# Patient Record
Sex: Female | Born: 1990 | Race: White | Hispanic: No | Marital: Married | Smoking: Never smoker
Health system: Southern US, Community
[De-identification: ages and names within clinical notes are randomized; demographics above are authoritative.]

## PROBLEM LIST (undated history)

## (undated) DIAGNOSIS — Z808 Family history of malignant neoplasm of other organs or systems: Secondary | ICD-10-CM

## (undated) DIAGNOSIS — Z800 Family history of malignant neoplasm of digestive organs: Secondary | ICD-10-CM

## (undated) DIAGNOSIS — Z8042 Family history of malignant neoplasm of prostate: Secondary | ICD-10-CM

## (undated) DIAGNOSIS — I7300 Raynaud's syndrome without gangrene: Secondary | ICD-10-CM

## (undated) HISTORY — PX: TUMOR REMOVAL: SHX12

## (undated) HISTORY — PX: CHOLECYSTECTOMY: SHX55

## (undated) HISTORY — DX: Family history of malignant neoplasm of other organs or systems: Z80.8

## (undated) HISTORY — DX: Family history of malignant neoplasm of prostate: Z80.42

## (undated) HISTORY — DX: Family history of malignant neoplasm of digestive organs: Z80.0

---

## 2021-06-12 ENCOUNTER — Encounter: Payer: Self-pay | Admitting: Psychiatry

## 2021-06-12 ENCOUNTER — Ambulatory Visit (INDEPENDENT_AMBULATORY_CARE_PROVIDER_SITE_OTHER): Payer: Commercial Managed Care - PPO | Admitting: Psychiatry

## 2021-06-12 ENCOUNTER — Other Ambulatory Visit: Payer: Self-pay

## 2021-06-12 DIAGNOSIS — F4323 Adjustment disorder with mixed anxiety and depressed mood: Secondary | ICD-10-CM

## 2021-06-12 NOTE — Progress Notes (Signed)
[facility] Counselor Initial Adult Exam  Name: Nicole Tate Date: 06/13/2021 MRN: [id number] DOB: [birth year] PCP: No primary care provider on file.  Time spent: 54 minutes start time 11:06 AM end time 12:00 PM   Guardian/Payee:  Patient    Paperwork requested:  Yes   Reason for Visit /Presenting Problem: Patient was present for session.  She was referred to treatment by a friend. Patient shared she has been having more anxiety.  She shared she and her husband were buying a house and the sellers backed out at the last minute and they had to end up moving in with friends for a while before they were able to find another home. Her husband had surgery and than they moved.  They have been married a year and they are still trying to figure out how to handle everything.  She is adjusting work marriage and moving. She shared there is lots of support at church which helped with all the moving. 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 She shared that she is finding herself at night not wanting to talk. Mom passed of Pancreatic cancer in 2010. Father remarried a year and half after mother passed.  Patient shared she is close to her stepmother.  There is a dynamic with her brother not getting along with her and father. It is difficult on the anniversary death date of her mother. Step mother has 2 children, everyone gets along well. Husband is in process of becoming a resident of the [place].  There are lots of cultural differences in the family.  She shared he is very calm but it is hard to handle the differecnes in their strategies of managing stress. Patient has taken Zoloft in the past currently on birth control pills currently.  Patient stated that she would like to learn some ways to manage all of the different stressors in her life and be able to connect with her husband in an appropriate manner.  Patient was  encouraged to continue thinking through what that would look like for her to be discussed further at next session when treatment planning goals will be developed.  Mental Status Exam:    Appearance:   Well Groomed     Behavior:  Appropriate  Motor:  Normal  Speech/Language:   Normal Rate  Affect:  Appropriate  Mood:  anxious  Thought process:  normal  Thought content:    WNL  Sensory/Perceptual disturbances:    WNL  Orientation:  oriented to person, place, time/date, and situation  Attention:  Good  Concentration:  Good  Memory:  WNL  Fund of knowledge:   Good  Insight:    Good  Judgment:   Good  Impulse Control:  Good   Reported Symptoms:  anxiety, crying spells, fatigue, sadness, sleep issues  Risk Assessment: Danger to Self:  No Self-injurious Behavior: No Danger to Others: No Duty to Warn:no Physical Aggression / Violence:No  Access to Firearms a concern: No  Gang Involvement:No  Patient / guardian was educated about steps to take if suicide or homicide risk level increases between visits: yes While future psychiatric events cannot be accurately predicted, the patient does not currently require acute inpatient psychiatric care and does not currently meet [facility] involuntary commitment criteria.  Substance Abuse History: Current substance abuse: No     Past Psychiatric History:   Previous psychological history is significant  for anxiety Outpatient Providers:no current but has seen 2 counselors prior History of Psych Hospitalization: No  Psychological Testing:  none    Abuse History: Victim of No.,  none    Report needed: No. Victim of Neglect:No. Perpetrator of  none   Witness / Exposure to Domestic Violence: No   Protective Services Involvement: No  Witness to [employer] Violence:  No   Family History:  Family History  Problem Relation Age of Onset   Depression Mother    Pancreatic cancer Mother     Living situation: the patient lives with their  spouse  Sexual Orientation:  Straight  Relationship Status: married  Name of spouse / other:Nicole Tate             If a parent, number of children / ages:none  Lawyer; spouse friends parents Community group at [employer] Stress:  No   Income/Employment/Disability: Employment  Financial planner: No   Educational History: Education: post Engineer, maintenance (IT) work or degree  Religion/Sprituality/World View:    Ephriam Knuckles goes to [employer]  Any cultural differences that may affect / interfere with treatment:  not applicable   Recreation/Hobbies: walking, hiking, reading, cooking  Stressors:Other: work, transitions, relationship transitions  Strengths:  Supportive Relationships, Church, [employer], and Spirituality  Barriers:  none   Legal History: Pending legal issue / charges:  none. History of legal issue / charges:  none  Medical History/Surgical History:reviewed History reviewed. No pertinent past medical history.  History reviewed. No pertinent surgical history.  Medications: No current outpatient medications on file.   No current facility-administered medications for this visit.  Birth control pills  Not on File  Diagnoses:    ICD-10-CM   1. Adjustment disorder with mixed anxiety and depressed mood  F43.23       Plan of Care: Patient is to develop treatment plan and set goals at next session.   Stevphen Meuse, [facility]

## 2021-08-06 ENCOUNTER — Ambulatory Visit (INDEPENDENT_AMBULATORY_CARE_PROVIDER_SITE_OTHER): Payer: Commercial Managed Care - PPO | Admitting: Psychiatry

## 2021-08-06 ENCOUNTER — Other Ambulatory Visit: Payer: Self-pay

## 2021-08-06 DIAGNOSIS — F4323 Adjustment disorder with mixed anxiety and depressed mood: Secondary | ICD-10-CM

## 2021-08-06 NOTE — Progress Notes (Signed)
      [facility] Counselor/Therapist Progress Note  Patient ID: Nicole Tate, MRN: [id number],    Date: 08/06/2021  Time Spent: 50 minutes start time 10:05 AM end time 10:55 AM  Treatment Type: Individual Therapy  Reported Symptoms: anxiety  Mental Status Exam:  Appearance:   Well Groomed     Behavior:  Appropriate  Motor:  Normal  Speech/Language:   Normal Rate  Affect:  Appropriate  Mood:  normal  Thought process:  normal  Thought content:    WNL  Sensory/Perceptual disturbances:    WNL  Orientation:  oriented to person, place, time/date, and situation  Attention:  Good  Concentration:  Good  Memory:  WNL  Fund of knowledge:   Good  Insight:    Good  Judgment:   Good  Impulse Control:  Good   Risk Assessment: Danger to Self:  No Self-injurious Behavior: No Danger to Others: No Duty to Warn:no Physical Aggression / Violence:No  Access to Firearms a concern: No  Gang Involvement:No   Subjective: Patient was present for session.  Develop treatment plan and set goals in session.  Patient reported the thing that was creating the most stress for her currently was her husband discussing possible work change.  Did EMDR set on that issue, suds level 8, negative cognition "I am not secure" felt anxiety in her chest.  Patient was able to reduce suds level to 4.  Discussed ways that she can communicate concerns and needs with has been appropriately.  Discussed different coping skills that she can utilize to help decrease anxiety appropriately.  Interventions: Solution-Oriented/Positive Psychology and Eye Movement Desensitization and Reprocessing (EMDR)  Diagnosis:   ICD-10-CM   1. Adjustment disorder with mixed anxiety and depressed mood  F43.23       Plan: Patient is to use coping skills to decrease anxiety symptoms.  Patient is to communicate her needs with her husband appropriately.  Patient is to exercise to release negative emotions appropriately. Long term goal:  Establish an inward sense of self worth confidence and competence Short term goal:Develop a healthy work life balance - developing healthy self care habits  Stevphen Meuse, [facility]

## 2021-08-20 ENCOUNTER — Ambulatory Visit (INDEPENDENT_AMBULATORY_CARE_PROVIDER_SITE_OTHER): Payer: Commercial Managed Care - PPO | Admitting: Psychiatry

## 2021-08-20 ENCOUNTER — Other Ambulatory Visit: Payer: Self-pay

## 2021-08-20 DIAGNOSIS — F4323 Adjustment disorder with mixed anxiety and depressed mood: Secondary | ICD-10-CM | POA: Diagnosis not present

## 2021-08-20 NOTE — Progress Notes (Signed)
      [facility] Counselor/Therapist Progress Note  Patient ID: Nicole Tate, MRN: [id number],    Date: 08/20/2021  Time Spent: 56 minutes start time 10:08 a.m. end time 11:02 a.m.  Treatment Type: Individual Therapy  Reported Symptoms: anxiety, headaches, obsessive thinking  Mental Status Exam:  Appearance:   Well Groomed     Behavior:  Appropriate  Motor:  Normal  Speech/Language:   Normal Rate  Affect:  Appropriate  Mood:  anxious  Thought process:  normal  Thought content:    WNL  Sensory/Perceptual disturbances:    WNL  Orientation:  oriented to person, place, time/date, and situation  Attention:  Good  Concentration:  Good  Memory:  WNL  Fund of knowledge:   Good  Insight:    Good  Judgment:   Good  Impulse Control:  Good   Risk Assessment: Danger to Self:  No Self-injurious Behavior: No Danger to Others: No Duty to Warn:no Physical Aggression / Violence:No  Access to Firearms a concern: No  Gang Involvement:No   Subjective: Patient was present for session.  She shared that she had continued processing after last session.  She was able to talk with her husband about the issues.  Patient stated she wants to work on issues with her husband concerning miscommunication in session.  Suds level 9, negative cognition "I am not explaining this well" felt anxiety and head.  Patient was able to reduce suds level to 5.  Patient was able to recognize that many of her issues are actually tied to unhealthy behaviors of her brother to try and help him deal with his issues.  Ways for her to take care of herself and be able to maintain relationships with her brother and husband were discussed with patient and plans were developed in session.  Interventions: Solution-Oriented/Positive Psychology, Insight-Oriented, and brain spotting  Diagnosis:   ICD-10-CM   1. Adjustment disorder with mixed anxiety and depressed mood  F43.23       Plan: Patient is to use CBT and coping  skills to decrease anxiety symptoms.  Patient is to write out the things that she loves about her husband to read when she gets triggered.  Patient is to follow plans from session to interact a little differently with her brother.  Patient is to release negative emotions appropriately through exercise and journaling. Long term goal: Establish an inward sense of self worth confidence and competence Short term goal:Develop a healthy work life balance - developing healthy self care habits  Stevphen Meuse, [facility]

## 2021-09-03 ENCOUNTER — Ambulatory Visit (INDEPENDENT_AMBULATORY_CARE_PROVIDER_SITE_OTHER): Payer: Commercial Managed Care - PPO | Admitting: Psychiatry

## 2021-09-03 ENCOUNTER — Other Ambulatory Visit: Payer: Self-pay

## 2021-09-03 DIAGNOSIS — F4323 Adjustment disorder with mixed anxiety and depressed mood: Secondary | ICD-10-CM

## 2021-09-03 NOTE — Progress Notes (Signed)
      [facility] Counselor/Therapist Progress Note  Patient ID: Nicole Tate, MRN: [id number],    Date: 09/03/2021  Time Spent: 60 minutes start time 10:06 Amend time 11:02 AM  Treatment Type: Individual Therapy  Reported Symptoms: anxiety, panic  Mental Status Exam:  Appearance:   Well Groomed     Behavior:  Appropriate  Motor:  Normal  Speech/Language:   Normal Rate  Affect:  Appropriate  Mood:  anxious  Thought process:  normal  Thought content:    WNL  Sensory/Perceptual disturbances:    WNL  Orientation:  oriented to person, place, time/date, and situation  Attention:  Good  Concentration:  Good  Memory:  WNL  Fund of knowledge:   Good  Insight:    Good  Judgment:   Good  Impulse Control:  Good   Risk Assessment: Danger to Self:  No Self-injurious Behavior: No Danger to Others: No Duty to Warn:no Physical Aggression / Violence:No  Access to Firearms a concern: No  Gang Involvement:No   Subjective: Patient was present for session. She shared she had panic over the weekend when she was at a high rise building in [place], the floor moved some and it created panic for her her.  Patient did processing set on that issue.  Suds level 5, negative cognition "I am out of control" felt anxiety and frustration in her chest.  Patient was able to reduce suds level to 3.  Encourage patient to recognize the need to continue with her self-care especially due to getting triggered with her work situation.  Patient agreed to continue finding ways to release negative emotions appropriately.  Interventions: Solution-Oriented/Positive Psychology, Eye Movement Desensitization and Reprocessing (EMDR), Insight-Oriented, and BS P  Diagnosis:   ICD-10-CM   1. Adjustment disorder with mixed anxiety and depressed mood  F43.23       Plan: Patient is to use CBT and coping skills to increase confidence.  Patient is to continue working on self-care through exercise.  Patient is to  affirm herself regularly. Long term goal: Establish an inward sense of self worth confidence and competence Short term goal:Develop a healthy work life balance - developing healthy self care habits  Stevphen Meuse, [facility]

## 2021-09-17 ENCOUNTER — Ambulatory Visit (INDEPENDENT_AMBULATORY_CARE_PROVIDER_SITE_OTHER): Payer: Commercial Managed Care - PPO | Admitting: Psychiatry

## 2021-09-17 ENCOUNTER — Other Ambulatory Visit: Payer: Self-pay

## 2021-09-17 DIAGNOSIS — F4323 Adjustment disorder with mixed anxiety and depressed mood: Secondary | ICD-10-CM | POA: Diagnosis not present

## 2021-09-17 NOTE — Progress Notes (Signed)
      [facility] Counselor/Therapist Progress Note  Patient ID: Ronny Ruddell, MRN: [id number],    Date: 09/17/2021  Time Spent: 48 minutes start time 11:12 AM end time 12 PM  Treatment Type: Individual Therapy  Reported Symptoms: anxiety, sadness  Mental Status Exam:  Appearance:   Well Groomed     Behavior:  Appropriate  Motor:  Normal  Speech/Language:   Normal Rate  Affect:  Appropriate  Mood:  anxious  Thought process:  normal  Thought content:    WNL  Sensory/Perceptual disturbances:    WNL  Orientation:  oriented to person, place, time/date, and situation  Attention:  Good  Concentration:  Good  Memory:  WNL  Fund of knowledge:   Good  Insight:    Good  Judgment:   Good  Impulse Control:  Good   Risk Assessment: Danger to Self:  No Self-injurious Behavior: No Danger to Others: No Duty to Warn:no Physical Aggression / Violence:No  Access to Firearms a concern: No  Gang Involvement:No   Subjective: Patient was present for session.  She shared she has made anxiety but she is managing it better and trying to work on her perspective.  She shared that there are lots of work changes happening and she is not sure where things are headed.  Patient did processing set on thinking about the worst case scenarios, suds level 7, negative cognition "I do not have control" felt helpless and vulnerable in her chest.  Patient was able to reduce suds level to 3.  She was able to recognize that much of what is going on is tied to early childhood situations.  Encouraged patient to allow processing to continue between sessions and journal as needed.  Interventions: Eye Movement Desensitization and Reprocessing (EMDR) and Insight-Oriented  Diagnosis:   ICD-10-CM   1. Adjustment disorder with mixed anxiety and depressed mood  F43.23       Plan: Patient is to use coping skills to decrease anxiety symptoms.  Patient is to journal to release negative emotions appropriately and allow  processing to continue.  Patient is to exercise and focus on self-care. Long term goal: Establish an inward sense of self worth confidence and competence Short term goal:Develop a healthy work life balance - developing healthy self care habits  Stevphen Meuse, [facility]

## 2021-10-10 ENCOUNTER — Ambulatory Visit (INDEPENDENT_AMBULATORY_CARE_PROVIDER_SITE_OTHER): Payer: Commercial Managed Care - PPO | Admitting: Psychiatry

## 2021-10-10 ENCOUNTER — Other Ambulatory Visit: Payer: Self-pay

## 2021-10-10 DIAGNOSIS — F4323 Adjustment disorder with mixed anxiety and depressed mood: Secondary | ICD-10-CM

## 2021-10-10 NOTE — Progress Notes (Signed)
°      [facility] Counselor/Therapist Progress Note  Patient ID: Nicole Tate, MRN: [id number],    Date: 10/10/2021  Time Spent: 55 minutes start time 12:04 PM end time 12:59 PM  Treatment Type: Individual Therapy  Reported Symptoms: anxiety, obsessive thoughts, sleep issues  Mental Status Exam:  Appearance:   Well Groomed     Behavior:  Appropriate  Motor:  Normal  Speech/Language:   Normal Rate  Affect:  Appropriate  Mood:  anxious  Thought process:  normal  Thought content:    WNL  Sensory/Perceptual disturbances:    WNL  Orientation:  oriented to person, place, time/date, and situation  Attention:  Good  Concentration:  Good  Memory:  WNL  Fund of knowledge:   Good  Insight:    Good  Judgment:   Good  Impulse Control:  Good   Risk Assessment: Danger to Self:  No Self-injurious Behavior: No Danger to Others: No Duty to Warn:no Physical Aggression / Violence:No  Access to Firearms a concern: No  Gang Involvement:No   Subjective: Patient was present for session.  She shared she is trying to figure out what to do concerning her job situation.  She went on to report she did get triggered when she went home over Thanksgiving.  She also had some obsessive thinking moments.  Patient did processing set on guy coming to the house, suds level 6, negative cognition "I am not safe" felt anxiety in her chest.  Patient was able to reduce suds level to 3.  Encourage patient to work on recognizing that the obsessive thinking is just giving her information and she needs to use that to help affirm herself and remind herself that she is safe.  Also encouraged her to follow through on things that she feels would help her to feel safer even in those situations.  Discussed doing some brain spotting relaxation exercises if needed.  Patient agreed to try some to see how she responds.  Interventions: Solution-Oriented/Positive Psychology, Eye Movement Desensitization and Reprocessing (EMDR),  Insight-Oriented, and BS P  Diagnosis:   ICD-10-CM   1. Adjustment disorder with mixed anxiety and depressed mood  F43.23       Plan: Patient is to use CBT and coping skills to decrease anxiety symptoms.  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  Patient is to practice brain spotting relaxation exercises.  Patient is to continue exercising to release negative emotions appropriately. Long term goal: Establish an inward sense of self worth confidence and competence Short term goal:Develop a healthy work life balance - developing healthy self care habits  Stevphen Meuse, [facility]

## 2021-10-25 ENCOUNTER — Other Ambulatory Visit: Payer: Self-pay

## 2021-10-25 ENCOUNTER — Ambulatory Visit (INDEPENDENT_AMBULATORY_CARE_PROVIDER_SITE_OTHER): Payer: Commercial Managed Care - PPO | Admitting: Psychiatry

## 2021-10-25 DIAGNOSIS — F4323 Adjustment disorder with mixed anxiety and depressed mood: Secondary | ICD-10-CM

## 2021-10-25 NOTE — Progress Notes (Signed)
°      [facility] Counselor/Therapist Progress Note  Patient ID: Nicole Tate, MRN: [id number],    Date: 10/25/2021  Time Spent: 59 minutes start time 12:03 PM end time 1:04 PM  Treatment Type: Individual Therapy  Reported Symptoms: anxiety, sadness, difficulty with intimacy  Mental Status Exam:  Appearance:   Well Groomed     Behavior:  Appropriate  Motor:  Normal  Speech/Language:   Normal Rate  Affect:  Appropriate and Tearful  Mood:  anxious and sad  Thought process:  normal  Thought content:    WNL  Sensory/Perceptual disturbances:    WNL  Orientation:  oriented to person, place, time/date, and situation  Attention:  Good  Concentration:  Good  Memory:  WNL  Fund of knowledge:   Good  Insight:    Good  Judgment:   Good  Impulse Control:  Good   Risk Assessment: Danger to Self:  No Self-injurious Behavior: No Danger to Others: No Duty to Warn:no Physical Aggression / Violence:No  Access to Firearms a concern: No  Gang Involvement:No   Subjective: Patient was present for session.  She shared that she has made a decision concerning her job.  She shared she is having some concerns with her relationship.  Did processing set on difficulty with her husband, suds level 9, negative cognition "there is something wrong with me" felt helpless and inadequate.  Her chest and throat.  Patient was able to reduce suds level to 5.  She was able to recognize that her anxiety is a lot of her issues.  Discussed different ways for her to work on her thoughts and help develop what she is wanting in her relationship.  Interventions: Solution-Oriented/Positive Psychology, Eye Movement Desensitization and Reprocessing (EMDR), and Insight-Oriented  Diagnosis:   ICD-10-CM   1. Adjustment disorder with mixed anxiety and depressed mood  F43.23       Plan: Patient is to use CBT and coping skills to decrease anxiety symptoms.  Patient is to discuss things from session with her husband.   Patient is to release negative emotions through exercise.  Long term goal: Establish an inward sense of self worth confidence and competence Short term goal:Develop a healthy work life balance - developing healthy self care habits  Stevphen Meuse, [facility]

## 2021-11-04 ENCOUNTER — Emergency Department (HOSPITAL_BASED_OUTPATIENT_CLINIC_OR_DEPARTMENT_OTHER)
Admission: EM | Admit: 2021-11-04 | Discharge: 2021-11-04 | Disposition: A | Discharge status: Home / Self Care | Payer: 59 | Attending: Emergency Medicine | Admitting: Emergency Medicine

## 2021-11-04 ENCOUNTER — Other Ambulatory Visit: Payer: Self-pay

## 2021-11-04 ENCOUNTER — Encounter (HOSPITAL_BASED_OUTPATIENT_CLINIC_OR_DEPARTMENT_OTHER): Payer: Self-pay | Admitting: Emergency Medicine

## 2021-11-04 ENCOUNTER — Emergency Department (HOSPITAL_BASED_OUTPATIENT_CLINIC_OR_DEPARTMENT_OTHER): Payer: 59

## 2021-11-04 DIAGNOSIS — H538 Other visual disturbances: Secondary | ICD-10-CM | POA: Diagnosis present

## 2021-11-04 DIAGNOSIS — H539 Unspecified visual disturbance: Secondary | ICD-10-CM

## 2021-11-04 DIAGNOSIS — R202 Paresthesia of skin: Secondary | ICD-10-CM

## 2021-11-04 HISTORY — DX: Raynaud's syndrome without gangrene: I73.00

## 2021-11-04 LAB — COMPREHENSIVE METABOLIC PANEL
ALT: 28 U/L (ref 0–44)
AST: 20 U/L (ref 15–41)
Albumin: 3.9 g/dL (ref 3.5–5.0)
Alkaline Phosphatase: 38 U/L (ref 38–126)
Anion gap: 7 (ref 5–15)
BUN: 11 mg/dL (ref 6–20)
CO2: 26 mmol/L (ref 22–32)
Calcium: 8.9 mg/dL (ref 8.9–10.3)
Chloride: 105 mmol/L (ref 98–111)
Creatinine, Ser: 0.89 mg/dL (ref 0.44–1.00)
GFR, Estimated: >60 mL/min (ref >60)
Glucose, Bld: 109 mg/dL — ABNORMAL HIGH (ref 70–99)
Potassium: 4 mmol/L (ref 3.5–5.1)
Sodium: 138 mmol/L (ref 135–145)
Total Bilirubin: 0.6 mg/dL (ref 0.3–1.2)
Total Protein: 6.8 g/dL (ref 6.5–8.1)

## 2021-11-04 LAB — CBC WITH DIFFERENTIAL/PLATELET
Abs Immature Granulocytes: 0.01 10*3/uL (ref 0.00–0.07)
Basophils Absolute: 0 10*3/uL (ref 0.0–0.1)
Basophils Relative: 1 %
Eosinophils Absolute: 0.1 10*3/uL (ref 0.0–0.5)
Eosinophils Relative: 1 %
HCT: 39.7 % (ref 36.0–46.0)
Hemoglobin: 13.4 g/dL (ref 12.0–15.0)
Immature Granulocytes: 0 %
Lymphocytes Relative: 15 %
Lymphs Abs: 0.8 10*3/uL (ref 0.7–4.0)
MCH: 29.9 pg (ref 26.0–34.0)
MCHC: 33.8 g/dL (ref 30.0–36.0)
MCV: 88.6 fL (ref 80.0–100.0)
Monocytes Absolute: 0.5 10*3/uL (ref 0.1–1.0)
Monocytes Relative: 9 %
Neutro Abs: 4.1 10*3/uL (ref 1.7–7.7)
Neutrophils Relative %: 74 %
Platelets: 202 10*3/uL (ref 150–400)
RBC: 4.48 MIL/uL (ref 3.87–5.11)
RDW: 12.5 % (ref 11.5–15.5)
WBC: 5.5 10*3/uL (ref 4.0–10.5)
nRBC: 0 % (ref 0.0–0.2)

## 2021-11-04 LAB — TROPONIN I (HIGH SENSITIVITY): Troponin I (High Sensitivity): 2 ng/L (ref <18)

## 2021-11-04 LAB — PREGNANCY, URINE: Preg Test, Ur: NEGATIVE

## 2021-11-04 MED ORDER — IOHEXOL 350 MG/ML SOLN
75.0000 mL | Freq: Once | INTRAVENOUS | Status: AC | PRN
  Administered 2021-11-04: 75 mL via INTRAVENOUS

## 2021-11-04 MED ORDER — TETRACAINE HCL 0.5 % OP SOLN
1.0000 [drp] | Freq: Once | OPHTHALMIC | Status: AC
  Administered 2021-11-04: 2 [drp] via OPHTHALMIC
  Filled 2021-11-04: qty 4

## 2021-11-04 NOTE — ED Notes (Signed)
Dr. Billy Fischer at bedside to assess pt's eye pressures prior to d/c

## 2021-11-04 NOTE — ED Triage Notes (Signed)
Pt reports blurred vision, worse in LT eye, since Wed after having cholecystectomy; seen at [facility] yesterday for same; today she started feeling tingling to LT side of face and LUE at 0945 today for approx 20 min; tingling has subsided now, but vision still blurry

## 2021-11-04 NOTE — ED Provider Notes (Addendum)
[facility] EMERGENCY DEPARTMENT Provider Note   CSN: [id number] Arrival date & time: 11/04/21  1104     History  Chief Complaint  Patient presents with   Eye Problem    Nicole Tate is a 31 y.o. female.  HPI     31 year old female with a history of Raynaud's phenomenon, symptomatic biliary colic status postcholecystectomy January 4 with Dr. Buzzy Han presents with concern for blurry vision since her surgery, an episode of paresthesias of her left face and arm today.  She was seen yesterday in urgent care for the blurred vision, evaluated and referred to ophthalmology.  Reports that typically her vision is 20/20, but yesterday when is evaluated she is 20/30 and 20/50.  Describes the vision changes as blurred vision.  Denies double vision, missing pieces of vision or visual field deficits.  It has been consistent since her surgery.  Denies any eye pain, tearing, discharge or irritation.  Describes a sensation of pressure behind her left eye without significant headache.  No nausea, vomiting.  No photophobia.  Has not had other neurologic symptoms until today.  Had an episode of left face and left arm tingling at 945AM today that lasted 20 minutes. Denies weakness, difficulty talking or walking, visual changes or facial droop.  That resolved now. Called EMS but given symptoms resolved they said she could go POV so she presents now with husband.   Since her surgery she has been slowly increasing her diet, has had bowel movements. No increasing abdominal pain or fevers. No urinary symptoms. No cough/dyspnea/chest pain  Family hx of pancreatitic cancer, no hx of early CVA. Denies hx of smoking, other drug use NO other personal hx of htn, hlpd or dm Home Medications Prior to Admission medications   Not on File      Allergies    Patient has no known allergies.    Review of Systems   Review of Systems See above  Physical Exam Updated Vital Signs BP 119/75    Pulse 78     Temp 98.2 F (36.8 C) (Oral)    Resp 19    Ht 5\' 7"  (1.702 m)    Wt 68.5 kg    SpO2 100%    BMI 23.65 kg/m  Physical Exam Vitals and nursing note reviewed.  Constitutional:      General: She is not in acute distress.    Appearance: Normal appearance. She is well-developed. She is not ill-appearing or diaphoretic.  HENT:     Head: Normocephalic and atraumatic.  Eyes:     General: No visual field deficit.    Extraocular Movements: Extraocular movements intact.     Conjunctiva/sclera: Conjunctivae normal.     Pupils: Pupils are equal, round, and reactive to light.  Cardiovascular:     Rate and Rhythm: Normal rate and regular rhythm.     Pulses: Normal pulses.     Heart sounds: Normal heart sounds. No murmur heard.   No friction rub. No gallop.  Pulmonary:     Effort: Pulmonary effort is normal. No respiratory distress.     Breath sounds: Normal breath sounds. No wheezing or rales.  Abdominal:     General: There is no distension.     Palpations: Abdomen is soft.     Tenderness: There is no abdominal tenderness. There is no guarding.     Comments: Incisions c/d/I, no erythema  Musculoskeletal:        General: No swelling or tenderness.     Cervical  back: Normal range of motion.  Skin:    General: Skin is warm and dry.     Findings: No erythema or rash.  Neurological:     General: No focal deficit present.     Mental Status: She is alert and oriented to person, place, and time.     GCS: GCS eye subscore is 4. GCS verbal subscore is 5. GCS motor subscore is 6.     Cranial Nerves: No cranial nerve deficit, dysarthria or facial asymmetry.     Sensory: No sensory deficit.     Motor: No weakness or tremor.     Coordination: Coordination normal. Finger-Nose-Finger Test normal.     Gait: Gait normal.    ED Results / Procedures / Treatments   Labs (all labs ordered are listed, but only abnormal results are displayed) Labs Reviewed  COMPREHENSIVE METABOLIC PANEL - Abnormal; Notable  for the following components:      Result Value   Glucose, Bld 109 (*)    All other components within normal limits  CBC WITH DIFFERENTIAL/PLATELET  PREGNANCY, URINE  TROPONIN I (HIGH SENSITIVITY)    EKG EKG Interpretation  Date/Time:  Sunday November 04 2021 12:02:02 EST Ventricular Rate:  79 PR Interval:  139 QRS Duration: 76 QT Interval:  368 QTC Calculation: 422 R Axis:   75 Text Interpretation: Sinus rhythm No previous ECGs available Confirmed by Alvira Monday ([id number]) on 11/04/2021 1:59:31 PM  Radiology CT ANGIO HEAD NECK W WO CM  Result Date: 11/04/2021 CLINICAL DATA:  Vertebral artery dissection suspected EXAM: CT ANGIOGRAPHY HEAD AND NECK TECHNIQUE: Multidetector CT imaging of the head and neck was performed using the standard protocol during bolus administration of intravenous contrast. Multiplanar CT image reconstructions and MIPs were obtained to evaluate the vascular anatomy. Carotid stenosis measurements (when applicable) are obtained utilizing NASCET criteria, using the distal internal carotid diameter as the denominator. CONTRAST:  80mL OMNIPAQUE IOHEXOL 350 MG/ML SOLN COMPARISON:  None. FINDINGS: CT HEAD FINDINGS Motion limited. Brain: No evidence of acute large vascular territory infarction, hemorrhage, hydrocephalus, extra-axial collection or mass lesion/mass effect. Vascular: See below. Skull: No acute fracture. Sinuses: Clear visualized sinuses. Orbits: No acute finding. Review of the MIP images confirms the above findings CTA NECK FINDINGS Aortic arch: Great vessel origins are patent without significant stenosis Right carotid system: No evidence of dissection, stenosis (50% or greater) or occlusion. Left carotid system: No evidence of dissection, stenosis (50% or greater) or occlusion. Vertebral arteries: Left dominant. No evidence of dissection, stenosis (50% or greater) or occlusion. Skeleton: No acute findings on limited assessment. Other neck: No acute findings limited  assessment. Upper chest: Visualized lung apices are clear. 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 Review of the MIP images confirms the above findings CTA HEAD FINDINGS Mildly limited evaluation due to venous contrast bolus timing/contamination. Anterior circulation: Bilateral intracranial ICAs MCAs and ACAs are patent without proximal hemodynamically significant stenosis. No aneurysm identified. Posterior circulation: Bilateral intradural vertebral arteries, basilar artery, and posterior cerebral arteries are patent without proximal hemodynamically significant stenosis. Bilateral PICAs are patent proximally. No aneurysm identified. Venous sinuses: No evidence of dural venous sinus thrombosis. Review of the MIP images confirms the above findings IMPRESSION: No evidence of dissection, large vessel occlusion or proximal hemodynamically significant stenosis. Electronically Signed   By: Feliberto Harts M.D.   On: 11/04/2021 14:05    Procedures Procedures    Medications Ordered in ED Medications  iohexol (OMNIPAQUE) 350 MG/ML injection 75 mL (75 mLs Intravenous Contrast  Given 11/04/21 1300)  tetracaine (PONTOCAINE) 0.5 % ophthalmic solution 1-2 drop (2 drops Both Eyes Given by Other 11/04/21 1440)    ED Course/ Medical Decision Making/ A&P                           Medical Decision Making   31 year old female with a history of Raynaud's phenomenon, symptomatic biliary colic status postcholecystectomy January 4 with Dr. Buzzy Haneppara presents with concern for blurry vision since her surgery, an episode of paresthesias of her left face and arm today.  DDx includes stroke, TIA, seizure, optic neuritis, CVT, MS, intracranial hypertension, PRES, electrolyte abnormalities including hyperglycemia, primary ophthalmologic disorder (glaucoma, corneal abrasion, CRAO, CVRO)  Eye pressures normal and bilateral ophthalmologic etiology seems less likely. No eye pain to suggest abrasion, iritis, optic neuritis.     CTA head and neck ordered given episode of left face and arm tingling and was evaluated by me and radiology shows no acute bleed, occlusion or aneurysm.  Some venous contrast timing without signs of dural venous sinus thrombosis and no significant headache-clinically have a lower suspicion for CVT in setting of no significant headache and no signs on CTA.  CTA does not show signs of vessel occlusion or dissection, no significant stenosis.    Labs evaluated by me and show no significant electrolyte abnormalities, no significant hyperglycemia. EKG and troponin evaluated given left arm tingling show no evidence of arrhythmia or ischemia. Duration of paresthesias does not seem consistent with focal seizure.  BP WNL, do not feel history iis consistent with PRES.    Discussed possibility of transfer to [facility] for further work up, evaluate for signs of MS/ischemia with MRI [facility] however acknowledge the wait and time associated with this.  It is difficult to rule out TIA or CVA, however given duration of visual changes since Wednesday, no visual field deficits/EOM abnormalities/diplopia and with normal pupils with normal CTA head and neck and no hx of htn,hlpd,dm,fam hx of early disease,smoking, feel it is not unreasonable to continue work up as an outpatient with Neurology, Ophthalmology and PCP and return for any new or worsening symptoms.  She and husband decline transfer [facility] and would like to pursue further outpatient evaluation.  Given numbers for Neurology, Ophthalmology and recommend PCP discussion as well. Patient discharged in stable condition with understanding of reasons to return.             Final Clinical Impression(s) / ED Diagnoses Final diagnoses:  Visual changes  Left face and left arm tingling    Rx / DC Orders ED Discharge Orders     None          Alvira MondaySchlossman, Marigold Mom, MD 11/05/21 [id number]

## 2021-11-04 NOTE — ED Notes (Signed)
Husband at bedside.  

## 2021-11-04 NOTE — ED Notes (Signed)
Gait steady, ambulatory to BR with nurse, no assist needed

## 2021-11-05 ENCOUNTER — Ambulatory Visit: Payer: Commercial Managed Care - PPO | Admitting: Psychiatry

## 2021-11-06 ENCOUNTER — Encounter: Payer: Self-pay | Admitting: Neurology

## 2021-11-08 ENCOUNTER — Ambulatory Visit (INDEPENDENT_AMBULATORY_CARE_PROVIDER_SITE_OTHER): Payer: Commercial Managed Care - PPO | Admitting: Psychiatry

## 2021-11-08 ENCOUNTER — Other Ambulatory Visit: Payer: Self-pay

## 2021-11-08 ENCOUNTER — Encounter: Payer: Self-pay | Admitting: Psychiatry

## 2021-11-08 DIAGNOSIS — F4323 Adjustment disorder with mixed anxiety and depressed mood: Secondary | ICD-10-CM | POA: Diagnosis not present

## 2021-11-08 NOTE — Progress Notes (Signed)
°      [facility] Counselor/Therapist Progress Note  Patient ID: Nicole Tate, MRN: [id number],    Date: 11/08/2021  Time Spent: 61 minutes start time 8:05 end time 9:06 AM  Treatment Type: Individual Therapy  Reported Symptoms: anxiety, sadness, sleep issues, panic  Mental Status Exam:  Appearance:   Well Groomed     Behavior:  Appropriate  Motor:  Normal  Speech/Language:   Normal Rate  Affect:  Appropriate tearful  Mood:  anxious  Thought process:  normal  Thought content:    WNL  Sensory/Perceptual disturbances:    WNL  Orientation:  oriented to person, place, time/date, and situation  Attention:  Good  Concentration:  Good  Memory:  WNL  Fund of knowledge:   Good  Insight:    Good  Judgment:   Good  Impulse Control:  Good   Risk Assessment: Danger to Self:  No Self-injurious Behavior: No Danger to Others: No Duty to Warn:no Physical Aggression / Violence:No  Access to Firearms a concern: No  Gang Involvement:No   Subjective: Patient was present for session.  She shared that her surgery went well last Wednesday and that was positive but she is started having some physical symptoms after the surgery and had to go to urgent care twice.  She went on to explain that physician decided to put her on Zoloft at this time due to her extreme anxiety.  She explained it all triggered anxiety for her, she shared that her fear of death was surfacing, suds level 10, negative cognition "I am out of control" felt stressed and uncomfortable in her chest.  Patient was able to reduce suds level to 6.  She was able to recognize that some of this seems to be tied to the death of her mother and her mother's depression.  Encouraged patient to journal and to work on her self-care just to see what may surface between sessions.  Interventions: Solution-Oriented/Positive Psychology, Eye Movement Desensitization and Reprocessing (EMDR), and Insight-Oriented  Diagnosis:   ICD-10-CM   1.  Adjustment disorder with mixed anxiety and depressed mood  F43.23       Plan: Patient is to use CBT and coping skills to decrease anxiety symptoms.  Patient is to continue working on her self-care including diet and exercise as she can with recovering from her surgery.  Patient is to journal to release negative emotions appropriately and to allow what needs to process to come to the surface.  Patient is to take medication as directed Long term goal: Establish an inward sense of self worth confidence and competence Short term goal:Develop a healthy work life balance - developing healthy self care habits  Stevphen Meuse, [facility]

## 2021-11-19 ENCOUNTER — Ambulatory Visit (INDEPENDENT_AMBULATORY_CARE_PROVIDER_SITE_OTHER): Payer: Commercial Managed Care - PPO | Admitting: Psychiatry

## 2021-11-19 ENCOUNTER — Other Ambulatory Visit: Payer: Self-pay

## 2021-11-19 DIAGNOSIS — F4323 Adjustment disorder with mixed anxiety and depressed mood: Secondary | ICD-10-CM

## 2021-11-19 NOTE — Progress Notes (Signed)
°      [facility] Counselor/Therapist Progress Note  Patient ID: Nicole Tate, MRN: [id number],    Date: 11/19/2021  Time Spent: 50 minutes start time 10:08 AM end time 10:58 AM  Treatment Type: Individual Therapy  Reported Symptoms: anxiety,  Mental Status Exam:  Appearance:   Well Groomed     Behavior:  Appropriate  Motor:  Normal  Speech/Language:   Normal Rate  Affect:  Appropriate  Mood:  anxious  Thought process:  normal  Thought content:    WNL  Sensory/Perceptual disturbances:    WNL  Orientation:  oriented to person, place, time/date, and situation  Attention:  Good  Concentration:  Good  Memory:  WNL  Fund of knowledge:   Good  Insight:    Good  Judgment:   Good  Impulse Control:  Good   Risk Assessment: Danger to Self:  No Self-injurious Behavior: No Danger to Others: No Duty to Warn:no Physical Aggression / Violence:No  Access to Firearms a concern: No  Gang Involvement:No   Subjective: Patient was present for session.  Patient reported that she was feeling that things are moving in a positive direction overall her anxiety has been able to decrease and she is healing from her surgery.  She shared that the 1 issue she is still having in her marriage is her physical intimacy.  Discussed the situation and why it could be an issue.  Developed a different plans to help her and her husband get back to what they are wanting for their marriage.  Reported feeling positive at the end of session and that she had some things that she could work on.  Interventions: Cognitive Behavioral Therapy, Solution-Oriented/Positive Psychology, and Insight-Oriented  Diagnosis:   ICD-10-CM   1. Adjustment disorder with mixed anxiety and depressed mood  F43.23       Plan: Patient is to use CBT and coping skills to continue improving her self confidence and competence.  Patient is to continue following through with plans to change to a new practice.  Patient is to work on plans  from session.  Patient is to exercise to release negative emotions appropriately. Long term goal: Establish an inward sense of self worth confidence and competence Short term goal:Develop a healthy work life balance - developing healthy self care habits  Stevphen Meuse, [facility]

## 2021-12-03 ENCOUNTER — Ambulatory Visit: Payer: Commercial Managed Care - PPO | Admitting: Psychiatry

## 2021-12-19 ENCOUNTER — Ambulatory Visit (INDEPENDENT_AMBULATORY_CARE_PROVIDER_SITE_OTHER): Payer: Commercial Managed Care - PPO | Admitting: Psychiatry

## 2021-12-19 ENCOUNTER — Other Ambulatory Visit: Payer: Self-pay

## 2021-12-19 DIAGNOSIS — F4323 Adjustment disorder with mixed anxiety and depressed mood: Secondary | ICD-10-CM

## 2021-12-19 NOTE — Progress Notes (Signed)
°      [facility] Counselor/Therapist Progress Note  Patient ID: Kendrah Lovern, MRN: [id number],    Date: 12/19/2021  Time Spent: 50 minutes start time 10:10 AM end time 11 AM  Treatment Type: Individual Therapy  Reported Symptoms: anxiety, fear  Mental Status Exam:  Appearance:   Well Groomed     Behavior:  Appropriate  Motor:  Normal  Speech/Language:   Normal Rate  Affect:  Appropriate  Mood:  anxious  Thought process:  normal  Thought content:    WNL  Sensory/Perceptual disturbances:    WNL  Orientation:  oriented to person, place, time/date, and situation  Attention:  Good  Concentration:  Good  Memory:  WNL  Fund of knowledge:   Good  Insight:    Good  Judgment:   Good  Impulse Control:  Good   Risk Assessment: Danger to Self:  No Self-injurious Behavior: No Danger to Others: No Duty to Warn:no Physical Aggression / Violence:No  Access to Firearms a concern: No  Gang Involvement:No   Subjective: Patient was present for session. She shared she had a MRI due to some vision issues and it was not normal so she is having to go through more testing. She shared she has still had that sense of doom that is concerning for her.  Did processing set on sense of doom.  Identified the picture as mom's death when things were out of control suds level 5, negative cognition "it is my fault" felt helplessness in her head.  Patient was able to reduce suds level to 2.  She was able to recognize that whenever she seems to have things going in her life in a positive direction and she feels at peace that something huge has happened and change that and that was more of the underlying issue.  Discussed ways to work on those thoughts to change them over the next few weeks.  Interventions: Cognitive Behavioral Therapy, Solution-Oriented/Positive Psychology, and Eye Movement Desensitization and Reprocessing (EMDR)  Diagnosis:   ICD-10-CM   1. Adjustment disorder with mixed anxiety and  depressed mood  F43.23       Plan: Patient is to use CBT and coping skills to decrease anxiety symptoms.  Patient is to write out the things that she loves about her husband to read when she gets triggered.  Patient is to follow plans from session to interact a little differently with her brother.  Patient is to release negative emotions appropriately through exercise and journaling. Long term goal: Establish an inward sense of self worth confidence and competence Short term goal:Develop a healthy work life balance - developing healthy self care habits  Stevphen Meuse, [facility]

## 2021-12-31 ENCOUNTER — Telehealth: Payer: Self-pay | Admitting: Neurology

## 2021-12-31 ENCOUNTER — Ambulatory Visit: Payer: Commercial Managed Care - PPO | Admitting: Psychiatry

## 2021-12-31 NOTE — Telephone Encounter (Signed)
Patient called and stated she had a MRI done on Feb 3.  She has a new patient appt coming up.  She just wanted to know if the dr would have access to this or if she need to sign any kind of release form? ?

## 2021-12-31 NOTE — Telephone Encounter (Signed)
Advised pt to have [facility] send over copies of the report to be on the safe side and if they can send the CD would be great. ?

## 2022-01-08 NOTE — Progress Notes (Signed)
?[facility] ?Neurology Division ?Clinic Note - Initial Visit ? ? ?Date: 01/09/22 ? ?Darrall Dears ?MRN: [id number] ?DOB: 13-Apr-1991 ? ? ?Dear Dr. Gerri Spore: ? ?Thank you for your kind referral of Nicole Tate for consultation of abnormal MRI. Although her history is well known to you, please allow [place] to reiterate it for the purpose of our medical record. The patient was accompanied to the clinic by self. ?  ? ?History of Present Illness: ?Nicole Tate is a 31 y.o. right-handed female with presenting for evaluation of abnormal MRI brain. She underwent cholecystectomy on January 4th.  The following day, she noticed blurred vision and was seen in the ER where CTA head and neck was normal.  The following day, she developed numbness/tingling of the left face and arm which lasted 20-30 min for a few days.  She went to ophthalmologist who prescribed prescription glasses, which has improved her blurred vision.  MRI brain wo contrast was performed on 11/30/2021 which shows mils scattered white matter changes.  She is here for my opinion of her imaging findings.  She denies any ongoing neurological symptoms such as numbness/tingling, vision loss, eye pain, headaches, or weakness.  ? ?She also complains of excessive yawning and having Raynaud's syndrome.   ? ?She works as a Pharmacist, hospital.  She lives at home with husband, no children.   ? ?Out-side paper records, electronic medical record, and images have been reviewed where available and summarized as:  ?CTA head and neck 11/04/2021:  No evidence of dissection, large vessel occlusion or proximal hemodynamically significant stenosis. ? ?MRI brain wo contrast 11/30/2021: ?No evidence of acute intracranial abnormality.  ? ?There are a few small scattered foci of T2 FLAIR hyperintense signal abnormality within the bilateral frontal lobe white matter (measuring 3 mm). These signal changes are nonspecific and differential considerations include age-advanced chronic  small vessel ischemic disease, sequela of a prior infectious/inflammatory process, sequela of chronic migraine headaches or less likely (given the distribution) sequela of a demyelinating process (such as multiple sclerosis), among others.  ? ?Otherwise unremarkable non-contrast MRI appearance of the brain.   ? ? ?Past Medical History:  ?Diagnosis Date  ? Raynaud's disease   ? ? ?Past Surgical History:  ?Procedure Laterality Date  ? CHOLECYSTECTOMY    ? TUMOR REMOVAL    ? ? ? ?Medications:  ?Outpatient Encounter Medications as of 01/09/2022  ?Medication Sig  ? levonorgestrel-ethinyl estradiol (SEASONALE) 0.15-0.03 MG tablet 1 tablet  ? sertraline (ZOLOFT) 50 MG tablet Take 50 mg by mouth daily.  ? ?No facility-administered encounter medications on file as of 01/09/2022.  ? ? ?Allergies: No Known Allergies ? ?Family History: ?Family History  ?Problem Relation Age of Onset  ? Depression Mother   ? Pancreatic cancer Mother   ? ? ?Social History: ?Social History  ? ?Tobacco Use  ? Smoking status: Never  ? Smokeless tobacco: Never  ?Vaping Use  ? Vaping Use: Never used  ?Substance Use Topics  ? Alcohol use: Yes  ?  Comment: Occasionally drinks once every 2 months  ? Drug use: Not Currently  ? ?Social History  ? ?Social History Narrative  ? Right handed   ? Lives in a one story home. Ranch style home  ? ? ?Vital Signs:  ?BP 115/75   Pulse 86   Ht 5\' 7"  (1.702 m)   Wt 161 lb (73 kg)   SpO2 100%   BMI 25.22 kg/m?  ? ?Neurological Exam: ?MENTAL STATUS including orientation to time, place,  person, recent and remote memory, attention span and concentration, language, and fund of knowledge is normal.  Speech is not dysarthric. ? ?CRANIAL NERVES: ?II:  No visual field defects.     ?III-IV-VI: Pupils equal round and reactive to light.  Normal conjugate, extra-ocular eye movements in all directions of gaze.  No nystagmus.  No ptosis.   ?V:  Normal facial sensation.    ?VII:  Normal facial symmetry and movements.   ?VIII:   Normal hearing and vestibular function.   ?IX-X:  Normal palatal movement.   ?XI:  Normal shoulder shrug and head rotation.   ?XII:  Normal tongue strength and range of motion, no deviation or fasciculation. ? ?MOTOR:  No atrophy, fasciculations or abnormal movements.  No pronator drift.  ? ?Upper Extremity:  Right  Left  ?Deltoid  5/5   5/5   ?Biceps  5/5   5/5   ?Triceps  5/5   5/5   ?Infraspinatus 5/5  5/5  ?Medial pectoralis 5/5  5/5  ?Wrist extensors  5/5   5/5   ?Wrist flexors  5/5   5/5   ?Finger extensors  5/5   5/5   ?Finger flexors  5/5   5/5   ?Dorsal interossei  5/5   5/5   ?Abductor pollicis  5/5   5/5   ?Tone (Ashworth scale)  0  0  ? ?Lower Extremity:  Right  Left  ?Hip flexors  5/5   5/5   ?Hip extensors  5/5   5/5   ?Adductor 5/5  5/5  ?Abductor 5/5  5/5  ?Knee flexors  5/5   5/5   ?Knee extensors  5/5   5/5   ?Dorsiflexors  5/5   5/5   ?Plantarflexors  5/5   5/5   ?Toe extensors  5/5   5/5   ?Toe flexors  5/5   5/5   ?Tone (Ashworth scale)  0  0  ? ?MSRs:  ?Right        Left                  ?brachioradialis 2+  2+  ?biceps 2+  2+  ?triceps 2+  2+  ?patellar 2+  2+  ?ankle jerk 2+  2+  ?Hoffman no  no  ?plantar response down  down  ? ?SENSORY:  Normal and symmetric perception of light touch, pinprick, vibration, and proprioception.  Romberg's sign absent.  ? ?COORDINATION/GAIT: Normal finger-to- nose-finger.  Intact rapid alternating movements bilaterally.  Able to rise from a chair without using arms.  Gait narrow based and stable. Tandem and stressed gait intact.  ? ? ?IMPRESSION: ?Episode of left face and arm paresthesias, transient.  No recurrence.  Vision symptoms have improved with prescription eyeglasses.  Neurological exam is normal.  MRI brain wo contrast was personally viewed (patient provided images on CD), which shows sparse, small white matter changes, none of which are suggestive of demyelinating disease.  Patient was reassured.  If she has any new neurological symptoms, repeat  imaging with contrast can be ordered.  To be complete, I will check vitamin B12, folate, TSH. ? ?Return to clinic as needed ? ? ?Thank you for allowing me to participate in patient's care.  If I can answer any additional questions, I would be pleased to do so.   ? ?Sincerely, ? ? ? ?Tajay Muzzy K. Allena Katz, DO ? ?

## 2022-01-09 ENCOUNTER — Other Ambulatory Visit (INDEPENDENT_AMBULATORY_CARE_PROVIDER_SITE_OTHER): Payer: 59

## 2022-01-09 ENCOUNTER — Other Ambulatory Visit: Payer: Self-pay

## 2022-01-09 ENCOUNTER — Ambulatory Visit (INDEPENDENT_AMBULATORY_CARE_PROVIDER_SITE_OTHER): Payer: 59 | Admitting: Neurology

## 2022-01-09 ENCOUNTER — Encounter: Payer: Self-pay | Admitting: Neurology

## 2022-01-09 VITALS — BP 115/75 | HR 86 | Ht 67.0 in | Wt 161.0 lb

## 2022-01-09 DIAGNOSIS — R202 Paresthesia of skin: Secondary | ICD-10-CM

## 2022-01-09 LAB — B12 AND FOLATE PANEL
Folate: 14.4 ng/mL (ref >5.9)
Vitamin B-12: 301 pg/mL (ref 211–911)

## 2022-01-09 LAB — TSH: TSH: 1.85 u[IU]/mL (ref 0.35–5.50)

## 2022-01-09 NOTE — Patient Instructions (Addendum)
Check labs ? ?If you develop any new neurological symptoms, please come back and see me ? ?

## 2022-01-14 ENCOUNTER — Ambulatory Visit (INDEPENDENT_AMBULATORY_CARE_PROVIDER_SITE_OTHER): Payer: Commercial Managed Care - PPO | Admitting: Psychiatry

## 2022-01-14 ENCOUNTER — Other Ambulatory Visit: Payer: Self-pay

## 2022-01-14 DIAGNOSIS — F4323 Adjustment disorder with mixed anxiety and depressed mood: Secondary | ICD-10-CM | POA: Diagnosis not present

## 2022-01-14 NOTE — Progress Notes (Signed)
?      [facility] Counselor/Therapist Progress Note ? ?Patient ID: Nicole Tate, MRN: [id number],   ? ?Date: 01/14/2022 ? ?Time Spent: 55 minutes start time 9:05 AM end time 10:00 AM ? ?Treatment Type: Individual Therapy ? ?Reported Symptoms: anxiety, obsessive thoughts ? ?Mental Status Exam: ? ?Appearance:   Well Groomed     ?Behavior:  Appropriate  ?Motor:  Normal  ?Speech/Language:   Normal Rate  ?Affect:  Appropriate  ?Mood:  normal  ?Thought process:  normal  ?Thought content:    WNL  ?Sensory/Perceptual disturbances:    WNL  ?Orientation:  oriented to person, place, time/date, and situation  ?Attention:  Good  ?Concentration:  Good  ?Memory:  WNL  ?Fund of knowledge:   Good  ?Insight:    Good  ?Judgment:   Good  ?Impulse Control:  Good  ? ?Risk Assessment: ?Danger to Self:  No ?Self-injurious Behavior: No ?Danger to Others: No ?Duty to Warn:no ?Physical Aggression / Violence:No  ?Access to Firearms a concern: No  ?Gang Involvement:No  ? ?Subjective: Patient was present for session.  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  She shared she is doing better health wise.  She explained she feels more balanced. She shared that there is still stress with their marriage due to family issues that trigger critical behavior.  Patient discussed the situation and was allowed time to think through what thoughts lead to her negative behaviors.  Encouraged patient to write out the positive things that her husband achieves and the things that she loves about him to read when she starts feeling agitated.  Patient was also able to realize that many of her thoughts are coming from expectations of the past that have been put on her.  Discussed different CBT skills to help her manage those emotions as well.  Also encouraged patient to start developing a closed group of friends that she can talk to about some of these issues in a safe manner when she is not in session.  Patient  reported feeling better at the end of session. ? ?Interventions: Cognitive Behavioral Therapy, Solution-Oriented/Positive Psychology, and Insight-Oriented ? ?Diagnosis: ?  ICD-10-CM   ?1. Adjustment disorder with mixed anxiety and depressed mood  F43.23   ?  ? ? ?Plan: Patient is to use CBT and coping skills to decrease anxiety symptoms.  Patient is to write out the things that her husband has achieved to read when she gets triggered.  Patient is to follow plans from session to develop a group of friends that she can confide in when she needs to vent about situations at home.  Patient is to release negative emotions appropriately through exercise and journaling. ?Long term goal: Establish an inward sense of self worth confidence and competence ?Short term goal:Develop a healthy work life balance - developing healthy self care habits ? ?Stevphen Meuse, [facility] ? ? ? ? ? ? ? ? ? ? ? ? ? ? ? ? ? ? ?

## 2022-01-28 ENCOUNTER — Ambulatory Visit: Payer: Commercial Managed Care - PPO | Admitting: Psychiatry

## 2022-02-18 ENCOUNTER — Ambulatory Visit: Payer: Commercial Managed Care - PPO | Admitting: Psychiatry

## 2022-03-18 ENCOUNTER — Ambulatory Visit: Payer: Commercial Managed Care - PPO | Admitting: Psychiatry

## 2022-04-18 ENCOUNTER — Telehealth: Payer: Self-pay | Admitting: Genetic Counselor

## 2022-04-18 NOTE — Telephone Encounter (Signed)
Scheduled appt per 6/21 referral. Pt is aware of appt date and time. Pt is aware to arrive 15 mins prior to appt time and to bring and updated insurance card. Pt is aware of appt location.   

## 2022-09-06 IMAGING — CT CT ANGIO HEAD-NECK (W OR W/O PERF)
1 of 14 series · 5 of 33 positions shown · IV contrast (Omnipaque)
Comparison: None.

CLINICAL DATA: Vertebral artery dissection suspected

EXAM:
CT ANGIOGRAPHY HEAD AND NECK
TECHNIQUE: Multidetector CT imaging of the head and neck was performed using
the standard protocol during bolus administration of intravenous
contrast. Multiplanar CT image reconstructions and MIPs were
obtained to evaluate the vascular anatomy. Carotid stenosis
measurements (when applicable) are obtained utilizing NASCET
criteria, using the distal internal carotid diameter as the
denominator.
CONTRAST:  75mL OMNIPAQUE IOHEXOL 350 MG/ML SOLN

[Series 14: axial thin · axial · 0.51mm/px · z∈[-223,+16]mm · 5 of 359 slices shown]
[im 60/359  soft-tissue]
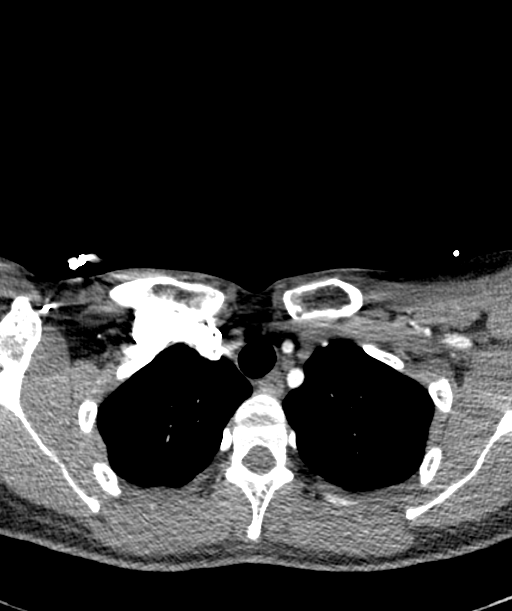
[im 120/359  bone]
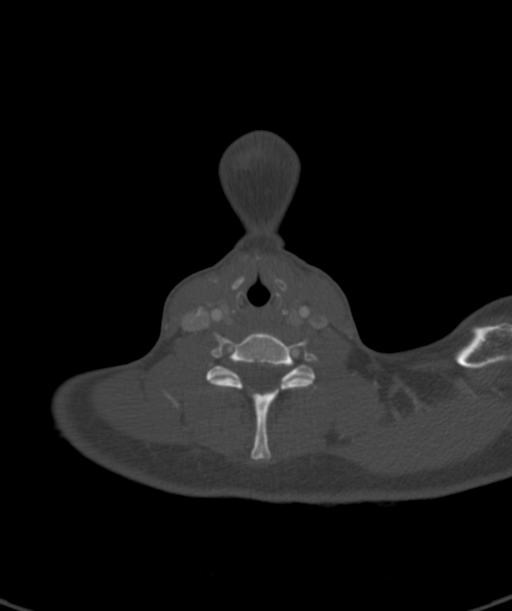
[im 180/359  soft-tissue]
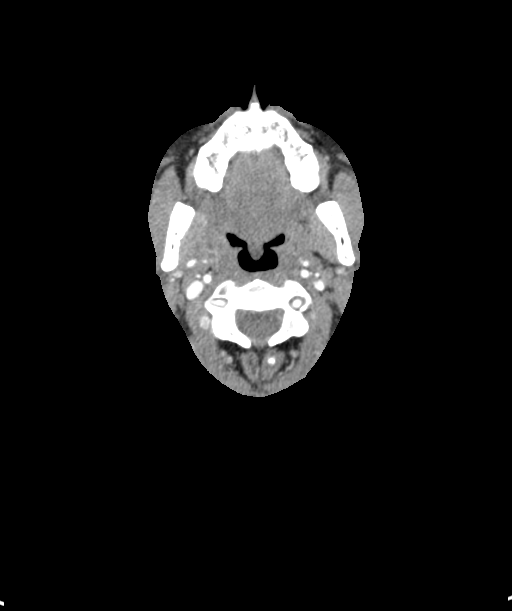
[im 239/359  bone]
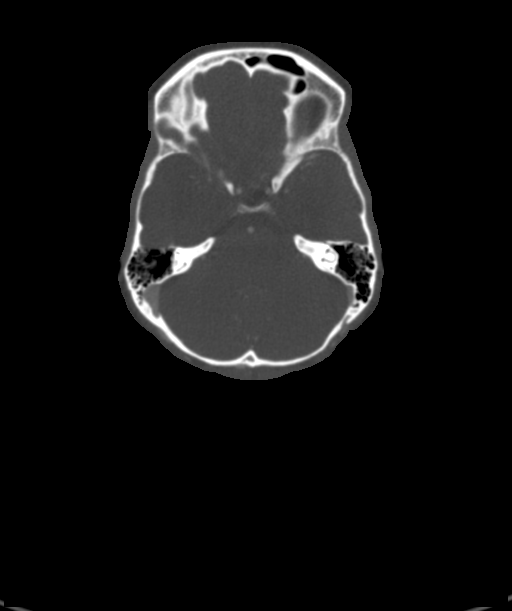
[im 299/359  soft-tissue]
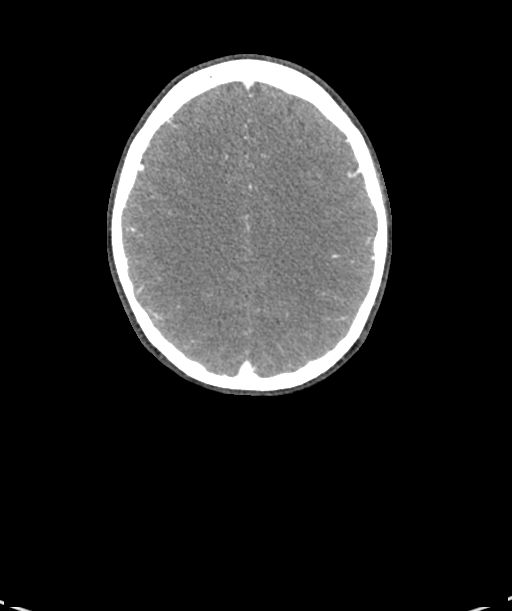

[5 of 33 positions shown; findings below may reference images not displayed]

FINDINGS: CT HEAD FINDINGS

Motion limited.

Brain: No evidence of acute large vascular territory infarction,
hemorrhage, hydrocephalus, extra-axial collection or mass
lesion/mass effect.

Vascular: See below.

Skull: No acute fracture.

Sinuses: Clear visualized sinuses.

Orbits: No acute finding.

Review of the MIP images confirms the above findings

CTA NECK FINDINGS

Aortic arch: Great vessel origins are patent without significant
stenosis

Right carotid system: No evidence of dissection, stenosis (50% or
greater) or occlusion.

Left carotid system: No evidence of dissection, stenosis (50% or
greater) or occlusion.

Vertebral arteries: Left dominant. No evidence of dissection,
stenosis (50% or greater) or occlusion.

Skeleton: No acute findings on limited assessment.

Other neck: No acute findings limited assessment.

Upper chest: Visualized lung apices are clear. Residual thymus in
the anterior mediastinum.

Review of the MIP images confirms the above findings

CTA HEAD FINDINGS

Mildly limited evaluation due to venous contrast bolus
timing/contamination.

Anterior circulation: Bilateral intracranial ICAs MCAs and ACAs are
patent without proximal hemodynamically significant stenosis. No
aneurysm identified.

Posterior circulation: Bilateral intradural vertebral arteries,
basilar artery, and posterior cerebral arteries are patent without
proximal hemodynamically significant stenosis. Bilateral PICAs are
patent proximally. No aneurysm identified.

Venous sinuses: No evidence of dural venous sinus thrombosis.

Review of the MIP images confirms the above findings
IMPRESSION: No evidence of dissection, large vessel occlusion or proximal
hemodynamically significant stenosis.

## 2022-10-28 NOTE — L&D Delivery Note (Addendum)
Delivery Note Nicole Tate "Nicole Tate" is a 32 year old G2 now P1011 who had a spontaneous delivery at [redacted]w[redacted]d a healthy baby girl was delivered via  (Presentation: vertex; ROA ).  APGAR:8,9 ; weight 8lb7oz.     Admitted for induction of labor for non-reactive NST and decreased fetal movement. Induced with pitocin and AROM. Progressed normally. Received epidural for pain management. Pushed for 1 hour and 31 minutes. Baby was delivered without difficulty. No nuchal cord.  Delayed cord clamping for 60 seconds. Delivery of placenta was spontaneous. Placenta was found to be intact, 3-vessel cord was noted. The fundus was found to be firm. 2nd degree perineal laceration was repaired in the normal sterile fashion with 3-0 Vicryl Rapide. DRE with good rectal tone and intact internal and external anal sphincters. Periurethral laceration was repaired in the typical sterile fashion with 3-0 Vicryl Rapide. Estimated blood loss 450cc. An additional fundal rub was performed with minimal lochia and fundus firm, below umbilicus. A lap sponge was placed in the vagina to hold pressure on areas of continued vaginal oozing with plan with RN to remove it prior to transfer to postpartum unit. Cord gases not sent. Instrument and gauze counts were correct at the end of the procedure.   Placenta status: L&D  Anesthesia: Epidural Episiotomy:  no Lacerations:  2nd degree, periurethral Suture Repair: 3.0 vicryl rapide Est. Blood Loss (mL):  450cc  Mom to postpartum.  Baby to Couplet care / Skin to Skin.  Nicole Tate 09/25/2023, 12:03 AM   Addendum Lap sponge removed from vagina at approximately 1:40am.Fundus firm and bleeding minimal prior to my departure from the room.

## 2022-11-21 ENCOUNTER — Other Ambulatory Visit: Payer: 59

## 2022-11-21 ENCOUNTER — Encounter: Payer: 59 | Admitting: Genetic Counselor

## 2023-02-27 LAB — OB RESULTS CONSOLE VARICELLA ZOSTER ANTIBODY, IGG: Varicella: NON-IMMUNE/NOT IMMUNE

## 2023-02-27 LAB — OB RESULTS CONSOLE GC/CHLAMYDIA
Chlamydia: NEGATIVE
Neisseria Gonorrhea: NEGATIVE

## 2023-02-27 LAB — HEPATITIS C ANTIBODY: HCV Ab: NEGATIVE

## 2023-02-27 LAB — OB RESULTS CONSOLE RUBELLA ANTIBODY, IGM: Rubella: IMMUNE

## 2023-07-07 LAB — OB RESULTS CONSOLE HIV ANTIBODY (ROUTINE TESTING): HIV: NONREACTIVE

## 2023-07-25 ENCOUNTER — Telehealth: Payer: Self-pay | Admitting: Genetic Counselor

## 2023-08-12 ENCOUNTER — Inpatient Hospital Stay: Payer: Commercial Managed Care - PPO | Attending: Genetic Counselor | Admitting: Genetic Counselor

## 2023-08-12 ENCOUNTER — Other Ambulatory Visit: Payer: Self-pay | Admitting: Genetic Counselor

## 2023-08-12 ENCOUNTER — Inpatient Hospital Stay: Payer: Commercial Managed Care - PPO

## 2023-08-12 ENCOUNTER — Encounter: Payer: Self-pay | Admitting: Genetic Counselor

## 2023-08-12 DIAGNOSIS — Z808 Family history of malignant neoplasm of other organs or systems: Secondary | ICD-10-CM

## 2023-08-12 DIAGNOSIS — Z800 Family history of malignant neoplasm of digestive organs: Secondary | ICD-10-CM

## 2023-08-12 DIAGNOSIS — Z8042 Family history of malignant neoplasm of prostate: Secondary | ICD-10-CM | POA: Diagnosis not present

## 2023-08-12 LAB — GENETIC SCREENING ORDER

## 2023-08-12 NOTE — Progress Notes (Signed)
REFERRING PROVIDER: Sherian Rein, MD [address] [place],  [place] [address]  PRIMARY PROVIDER:  Pcp, No  PRIMARY REASON FOR VISIT:  1. Family history of pancreatic cancer   2. Family history of prostate cancer   3. Family history of colon cancer   4. Family history of melanoma      HISTORY OF PRESENT ILLNESS:   Nicole Tate, a 32 y.o. female, was seen for a [facility] cancer genetics consultation at the request of Dr. Hinton Rao due to a family history of cancer.  Nicole Tate presents to clinic today to discuss the possibility of a hereditary predisposition to cancer, genetic testing, and to further clarify her future cancer risks, as well as potential cancer risks for family members.   Nicole Tate is a 32 y.o. female with no personal history of cancer.    CANCER HISTORY:  Oncology History   No history exists.     RISK FACTORS:  Menarche was at age 9.  First live birth at age 24.  OCP use for approximately 12 years.  Ovaries intact: yes.  Hysterectomy: no.  Menopausal status: premenopausal.  HRT use: 0 years. Colonoscopy: no; not examined. Mammogram within the last year: no. Number of breast biopsies: 0. Up to date with pelvic exams: yes. Any excessive radiation exposure in the past: no  Past Medical History:  Diagnosis Date   Family history of colon cancer    Family history of melanoma    Family history of pancreatic cancer    Family history of prostate cancer    Raynaud's disease     Past Surgical History:  Procedure Laterality Date   CHOLECYSTECTOMY     TUMOR REMOVAL      Social History   Socioeconomic History   Marital status: Married    Spouse name: Not on file   Number of children: 0   Years of education: Not on file   Highest education level: Not on file  Occupational History   Not on file  Tobacco Use   Smoking status: Never   Smokeless tobacco: Never  Vaping Use   Vaping status: Never Used  Substance and Sexual Activity    Alcohol use: Yes    Comment: Occasionally drinks once every 2 months   Drug use: Not Currently   Sexual activity: Not on file  Other Topics Concern   Not on file  Social History Narrative   Right handed    Lives in a one story home. Ranch style home   Social Determinants of Health   Financial Resource Strain: Not on file  Food Insecurity: Low Risk  (01/29/2023)   [facility]   Hunger Vital Sign    Worried About Running Out of Food in the Last Year: Never true    Ran Out of Food in the Last Year: Never true  Transportation Needs: No Transportation Needs (01/29/2023)   [facility]   Transportation    In the past 12 months, has lack of reliable transportation kept you from medical appointments, meetings, work or from getting things needed for daily living? : No  Physical Activity: Not on file  Stress: Not on file  Social Connections: Not on file     FAMILY HISTORY:  We obtained a detailed, 4-generation family history.  Significant diagnoses are listed below: Family History  Problem Relation Age of Onset   Depression Mother    Pancreatic cancer Mother 32  Melanoma Mother 63   Colon cancer Maternal Grandmother 52   Prostate cancer Maternal Grandfather 84   Parkinson's disease Paternal Grandfather    Pancreatic cancer Other 35       MGMs father   Prostate cancer Other 11       MGMs father     The patient is currently pregnant with her first child.  She has a brother with three children who are all cancer free. Her mother is deceased and her father is living.  The patient's father is cancer free.  He has two brothers who are cancer free.  There is no reported family history of cancer on the paternal side.  The patient's mother was diagnosed with melanoma at 41 and pancreatic cancer at 44.  She has one sister who is cancer free.  Her mother had colon cancer at 62 and her father had prostate cancer at 52.  Her  mother's father had pancreatic cancer at 68 and prostate cancer at 74.  Nicole Tate is unaware of previous family history of genetic testing for hereditary cancer risks.  There is no reported Ashkenazi Jewish ancestry. There is no known consanguinity.  GENETIC COUNSELING ASSESSMENT: Nicole Tate is a 32 y.o. female with a family history of cancer which is somewhat suggestive of a hereditary cancer syndrome and predisposition to cancer given the young ages of onset and combination of cancer. We, therefore, discussed and recommended the following at today's visit.   DISCUSSION: We discussed that, in general, most cancer is not inherited in families, but instead is sporadic or familial. Sporadic cancers occur by chance and typically happen at older ages (>50 years) as this type of cancer is caused by genetic changes acquired during an individual's lifetime. Some families have more cancers than would be expected by chance; however, the ages or types of cancer are not consistent with a known genetic mutation or known genetic mutations have been ruled out. This type of familial cancer is thought to be due to a combination of multiple genetic, environmental, hormonal, and lifestyle factors. While this combination of factors likely increases the risk of cancer, the exact source of this risk is not currently identifiable or testable.  We discussed that 12 - 13% of cancer is hereditary, with most cases of pancreatic cancer associated with BRCA mutations.  There are other genes that can be associated with hereditary pancreatic cancer syndromes.  These include CDKN2A and Lynch syndrome.  Due to her mother's diagnosis of melanoma at 66 we are concerned about CDKN2A, but also for Lynch syndrome based on her grandmother's diagnosis of colon cancer and great grandfathers pancreatic cancer  We discussed that testing is beneficial for several reasons including knowing how to follow individuals after completing their treatment,  identifying whether potential treatment options such as PARP inhibitors would be beneficial, and understand if other family members could be at risk for cancer and allow them to undergo genetic testing.   We reviewed the characteristics, features and inheritance patterns of hereditary cancer syndromes. We also discussed genetic testing, including the appropriate family members to test, the process of testing, insurance coverage and turn-around-time for results. We discussed the implications of a negative, positive, carrier and/or variant of uncertain significant result. Nicole Tate  was offered a common hereditary cancer panel (40+ genes) and an expanded pan-cancer panel (70+ genes). Nicole Tate was informed of the benefits and limitations of each panel, including that expanded pan-cancer panels contain genes that do not have clear management guidelines  at this point in time.  We also discussed that as the number of genes included on a panel increases, the chances of variants of uncertain significance increases. Nicole Tate decided to pursue genetic testing for the CancerNext-Expanded+RNAinsight gene panel.   The CancerNext-Expanded gene panel offered by [facility] and includes sequencing and rearrangement analysis for the following 77 genes: AIP, ALK, APC*, ATM*, AXIN2, BAP1, BARD1, BMPR1A, BRCA1*, BRCA2*, BRIP1*, CDC73, CDH1*, CDK4, CDKN1B, CDKN2A, CHEK2*, CTNNA1, DICER1, FH, FLCN, KIF1B, LZTR1, MAX, MEN1, MET, MLH1*, MSH2*, MSH3, MSH6*, MUTYH*, NF1*, NF2, NTHL1, PALB2*, PHOX2B, PMS2*, POT1, PRKAR1A, PTCH1, PTEN*, RAD51C*, RAD51D*, RB1, RET, SDHA, SDHAF2, SDHB, SDHC, SDHD, SMAD4, SMARCA4, SMARCB1, SMARCE1, STK11, SUFU, TMEM127, TP53*, TSC1, TSC2, and VHL (sequencing and deletion/duplication); EGFR, EGLN1, HOXB13, KIT, MITF, PDGFRA, POLD1, and POLE (sequencing only); EPCAM and GREM1 (deletion/duplication only). DNA and RNA analyses performed for * genes.   Based on Nicole Tate family history of cancer, she meets  medical criteria for genetic testing. Despite that she meets criteria, she may still have an out of pocket cost. We discussed that if her out of pocket cost for testing is over $100, the laboratory will call and confirm whether she wants to proceed with testing.  If the out of pocket cost of testing is less than $100 she will be billed by the genetic testing laboratory.   We discussed that some people do not want to undergo genetic testing due to fear of genetic discrimination.  The Genetic Information Nondiscrimination Act (GINA) was signed into federal law in 2008. GINA prohibits health insurers and most employers from discriminating against individuals based on genetic information (including the results of genetic tests and family history information). According to GINA, health insurance companies cannot consider genetic information to be a preexisting condition, nor can they use it to make decisions regarding coverage or rates. GINA also makes it illegal for most employers to use genetic information in making decisions about hiring, firing, promotion, or terms of employment. It is important to note that GINA does not offer protections for life insurance, disability insurance, or long-term care insurance. GINA does not apply to those in the [employer], those who work for companies with less than 15 employees, and new life insurance or long-term disability insurance policies.  Health status due to a cancer diagnosis is not protected under GINA. More information about GINA can be found by visiting [email or web address].     PLAN: After considering the risks, benefits, and limitations, Nicole Tate provided informed consent to pursue genetic testing and the blood sample was sent to [facility] for analysis of the CancerNext-Expanded+RNAinsight. Results should be available within approximately 2-3 weeks' time, at which point they will be disclosed by telephone to Nicole Tate, as will any additional  recommendations warranted by these results. Nicole Tate will receive a summary of her genetic counseling visit and a copy of her results once available. This information will also be available in Epic.   Based on Nicole Tate family history, we recommended her grandmother, who was diagnosed with colon cancer at age 82, have genetic counseling and testing. Nicole Tate will let us know if we can be of any assistance in coordinating genetic counseling and/or testing for this family member.   Lastly, we encouraged Nicole Tate to remain in contact with cancer genetics annually so that we can continuously update the family history and inform her of any changes in cancer genetics and testing that may be of benefit for this family.   Ms.  Tate's questions were answered to her satisfaction today. Our contact information was provided should additional questions or concerns arise. Thank you for the referral and allowing [place] to share in the care of your patient.   Farley Crooker P. Lowell Guitar, MS, [facility] Licensed, Patent attorney [email or web address] phone: [phone]  The patient was seen for a total of 35 minutes in face-to-face genetic counseling.  The patient was seen alone.  Drs. Meliton Rattan, and/or [place] were available for questions, if needed..    _______________________________________________________________________ For Office Staff:  Number of people involved in session: 1 Was an Intern/ student involved with case: no

## 2023-09-02 ENCOUNTER — Encounter: Payer: Self-pay | Admitting: Genetic Counselor

## 2023-09-02 DIAGNOSIS — Z1379 Encounter for other screening for genetic and chromosomal anomalies: Secondary | ICD-10-CM | POA: Insufficient documentation

## 2023-09-03 ENCOUNTER — Ambulatory Visit: Payer: Self-pay | Admitting: Genetic Counselor

## 2023-09-03 ENCOUNTER — Encounter: Payer: Self-pay | Admitting: Genetic Counselor

## 2023-09-03 DIAGNOSIS — Z1379 Encounter for other screening for genetic and chromosomal anomalies: Secondary | ICD-10-CM

## 2023-09-03 NOTE — Progress Notes (Signed)
HPI:  Nicole Tate was previously seen in the [facility] due to a family history of cancer and concerns regarding a hereditary predisposition to cancer. Please refer to our prior cancer genetics clinic note for more information regarding our discussion, assessment and recommendations, at the time. Nicole Tate recent genetic test results were disclosed to her, as were recommendations warranted by these results. These results and recommendations are discussed in more detail below.  CANCER HISTORY:  Oncology History   No history exists.    FAMILY HISTORY:  We obtained a detailed, 4-generation family history.  Significant diagnoses are listed below: Family History  Problem Relation Age of Onset   Depression Mother    Pancreatic cancer Mother 30   Melanoma Mother 43   Colon cancer Maternal Grandmother 55   Prostate cancer Maternal Grandfather 37   Parkinson's disease Paternal Grandfather    Pancreatic cancer Other 5       MGMs father   Prostate cancer Other 14       MGMs father       The patient is currently pregnant with her first child.  She has a brother with three children who are all cancer free. Her mother is deceased and her father is living.   The patient's father is cancer free.  He has two brothers who are cancer free.  There is no reported family history of cancer on the paternal side.   The patient's mother was diagnosed with melanoma at 62 and pancreatic cancer at 25.  She has one sister who is cancer free.  Her mother had colon cancer at 96 and her father had prostate cancer at 18.  Her mother's father had pancreatic cancer at 67 and prostate cancer at 55.   Nicole Tate is unaware of previous family history of genetic testing for hereditary cancer risks.  There is no reported Ashkenazi Jewish ancestry. There is no known consanguinity.  GENETIC TEST RESULTS: Genetic testing reported out on August 31, 2023 through the CancerNext-Expanded+RNAinsight cancer  panel found no pathogenic mutations. The CancerNext-Expanded gene panel offered by [facility] and includes sequencing and rearrangement analysis for the following 77 genes: AIP, ALK, APC*, ATM*, AXIN2, BAP1, BARD1, BMPR1A, BRCA1*, BRCA2*, BRIP1*, CDC73, CDH1*, CDK4, CDKN1B, CDKN2A, CHEK2*, CTNNA1, DICER1, FH, FLCN, KIF1B, LZTR1, MAX, MEN1, MET, MLH1*, MSH2*, MSH3, MSH6*, MUTYH*, NF1*, NF2, NTHL1, PALB2*, PHOX2B, PMS2*, POT1, PRKAR1A, PTCH1, PTEN*, RAD51C*, RAD51D*, RB1, RET, SDHA, SDHAF2, SDHB, SDHC, SDHD, SMAD4, SMARCA4, SMARCB1, SMARCE1, STK11, SUFU, TMEM127, TP53*, TSC1, TSC2, and VHL (sequencing and deletion/duplication); EGFR, EGLN1, HOXB13, KIT, MITF, PDGFRA, POLD1, and POLE (sequencing only); EPCAM and GREM1 (deletion/duplication only). DNA and RNA analyses performed for * genes. The test report has been scanned into EPIC and is located under the Molecular Pathology section of the Results Review tab.  A portion of the result report is included below for reference.     We discussed with Nicole Tate that because current genetic testing is not perfect, it is possible there may be a gene mutation in one of these genes that current testing cannot detect, but that chance is small.  We also discussed, that there could be another gene that has not yet been discovered, or that we have not yet tested, that is responsible for the cancer diagnoses in the family. It is also possible there is a hereditary cause for the cancer in the family that Nicole Tate did not inherit and therefore was not identified in her testing.  Therefore,  it is important to remain in touch with cancer genetics in the future so that we can continue to offer Nicole Tate the most up to date genetic testing.   ADDITIONAL GENETIC TESTING: We discussed with Nicole Tate that her genetic testing was fairly extensive.  If there are genes identified to increase cancer risk that can be analyzed in the future, we would be happy to discuss and coordinate this  testing at that time.    CANCER SCREENING RECOMMENDATIONS: Nicole Tate test result is considered negative (normal).  This means that we have not identified a hereditary cause for her family history of cancer at this time. Most cancers happen by chance and this negative test suggests that her family history of cancer may fall into this category.    Possible reasons for Nicole Tate negative genetic test include:  1. There may be a gene mutation in one of these genes that current testing methods cannot detect but that chance is small.  2. There could be another gene that has not yet been discovered, or that we have not yet tested, that is responsible for the cancer diagnoses in the family.  3.  There may be no hereditary risk for cancer in the family. The cancers in Nicole Tate and/or her family may be sporadic/familial or due to other genetic and environmental factors. 4. It is also possible there is a hereditary cause for the cancer in the family that Nicole Tate did not inherit.  Therefore, it is recommended she continue to follow the cancer management and screening guidelines provided by her primary healthcare provider. An individual's cancer risk and medical management are not determined by genetic test results alone. Overall cancer risk assessment incorporates additional factors, including personal medical history, family history, and any available genetic information that may result in a personalized plan for cancer prevention and surveillance  RECOMMENDATIONS FOR FAMILY MEMBERS:  Individuals in this family might be at some increased risk of developing cancer, over the general population risk, simply due to the family history of cancer.  We recommended women in this family have a yearly mammogram beginning at age 50, or 62 years younger than the earliest onset of cancer, an annual clinical breast exam, and perform monthly breast self-exams. Women in this family should also have a gynecological exam as  recommended by their primary provider. All family members should be referred for colonoscopy starting at age 6.  It is also possible there is a hereditary cause for the cancer in Nicole Tate's family that she did not inherit and therefore was not identified in her.  Based on Nicole Tate family history, we recommended her maternal grandmother, who was diagnosed with colon cancer in her 81's, and/or her brother, have genetic counseling and testing. Nicole Tate will let us know if we can be of any assistance in coordinating genetic counseling and/or testing for this family member.   FOLLOW-UP: Lastly, we discussed with Nicole Tate that cancer genetics is a rapidly advancing field and it is possible that new genetic tests will be appropriate for her and/or her family members in the future. We encouraged her to remain in contact with cancer genetics on an annual basis so we can update her personal and family histories and let her know of advances in cancer genetics that may benefit this family.   Our contact number was provided. Nicole Tate questions were answered to her satisfaction, and she knows she is welcome to call us at anytime with additional questions or  concerns.   Maylon Cos, MS, [facility] Licensed, Certified Genetic Counselor [email or web address]

## 2023-09-11 LAB — OB RESULTS CONSOLE GBS: GBS: NEGATIVE

## 2023-09-24 ENCOUNTER — Inpatient Hospital Stay (HOSPITAL_COMMUNITY): Payer: Commercial Managed Care - PPO | Admitting: Anesthesiology

## 2023-09-24 ENCOUNTER — Inpatient Hospital Stay (HOSPITAL_COMMUNITY)
Admission: AD | Admit: 2023-09-24 | Discharge: 2023-09-26 | DRG: 807 | Disposition: A | Discharge status: Home / Self Care | Payer: Commercial Managed Care - PPO | Attending: Obstetrics and Gynecology | Admitting: Obstetrics and Gynecology

## 2023-09-24 ENCOUNTER — Encounter (HOSPITAL_COMMUNITY): Payer: Self-pay | Admitting: Obstetrics and Gynecology

## 2023-09-24 ENCOUNTER — Other Ambulatory Visit: Payer: Self-pay

## 2023-09-24 DIAGNOSIS — Z820 Family history of epilepsy and other diseases of the nervous system: Secondary | ICD-10-CM

## 2023-09-24 DIAGNOSIS — Z808 Family history of malignant neoplasm of other organs or systems: Secondary | ICD-10-CM

## 2023-09-24 DIAGNOSIS — Z800 Family history of malignant neoplasm of digestive organs: Secondary | ICD-10-CM | POA: Diagnosis not present

## 2023-09-24 DIAGNOSIS — O368130 Decreased fetal movements, third trimester, not applicable or unspecified: Principal | ICD-10-CM | POA: Diagnosis present

## 2023-09-24 DIAGNOSIS — Z3A39 39 weeks gestation of pregnancy: Secondary | ICD-10-CM

## 2023-09-24 DIAGNOSIS — Z3493 Encounter for supervision of normal pregnancy, unspecified, third trimester: Principal | ICD-10-CM

## 2023-09-24 LAB — CBC
HCT: 40.8 % (ref 36.0–46.0)
Hemoglobin: 14.2 g/dL (ref 12.0–15.0)
MCH: 30.8 pg (ref 26.0–34.0)
MCHC: 34.8 g/dL (ref 30.0–36.0)
MCV: 88.5 fL (ref 80.0–100.0)
Platelets: 154 10*3/uL (ref 150–400)
RBC: 4.61 MIL/uL (ref 3.87–5.11)
RDW: 12.5 % (ref 11.5–15.5)
WBC: 7.8 10*3/uL (ref 4.0–10.5)
nRBC: 0 % (ref 0.0–0.2)

## 2023-09-24 LAB — TYPE AND SCREEN
ABO/RH(D): A POS
Antibody Screen: NEGATIVE

## 2023-09-24 MED ORDER — PHENYLEPHRINE 80 MCG/ML (10ML) SYRINGE FOR IV PUSH (FOR BLOOD PRESSURE SUPPORT)
80.0000 ug | PREFILLED_SYRINGE | INTRAVENOUS | Status: DC | PRN
Start: 2023-09-24 — End: 2023-09-25
  Filled 2023-09-24: qty 10

## 2023-09-24 MED ORDER — ONDANSETRON HCL 4 MG/2ML IJ SOLN
4.0000 mg | Freq: Four times a day (QID) | INTRAMUSCULAR | Status: DC | PRN
  Administered 2023-09-24: 4 mg via INTRAVENOUS
  Filled 2023-09-24: qty 2

## 2023-09-24 MED ORDER — OXYTOCIN-SODIUM CHLORIDE 30-0.9 UT/500ML-% IV SOLN
1.0000 m[IU]/min | INTRAVENOUS | Status: DC
  Administered 2023-09-24: 2 m[IU]/min via INTRAVENOUS
  Filled 2023-09-24: qty 500

## 2023-09-24 MED ORDER — LACTATED RINGERS IV SOLN: INTRAVENOUS | Status: DC

## 2023-09-24 MED ORDER — OXYTOCIN-SODIUM CHLORIDE 30-0.9 UT/500ML-% IV SOLN: 2.5000 [IU]/h | INTRAVENOUS | Status: DC

## 2023-09-24 MED ORDER — FENTANYL-BUPIVACAINE-NACL 0.5-0.125-0.9 MG/250ML-% EP SOLN
12.0000 mL/h | EPIDURAL | Status: DC | PRN
  Administered 2023-09-24: 12 mL/h via EPIDURAL
  Filled 2023-09-24: qty 250

## 2023-09-24 MED ORDER — LACTATED RINGERS IV SOLN: 500.0000 mL | INTRAVENOUS | Status: DC | PRN

## 2023-09-24 MED ORDER — EPHEDRINE 5 MG/ML INJ: 10.0000 mg | INTRAVENOUS | Status: DC | PRN

## 2023-09-24 MED ORDER — EPHEDRINE 5 MG/ML INJ
10.0000 mg | INTRAVENOUS | Status: DC | PRN
Start: 2023-09-24 — End: 2023-09-25

## 2023-09-24 MED ORDER — TERBUTALINE SULFATE 1 MG/ML IJ SOLN: 0.2500 mg | Freq: Once | INTRAMUSCULAR | Status: DC | PRN

## 2023-09-24 MED ORDER — OXYCODONE-ACETAMINOPHEN 5-325 MG PO TABS: 1.0000 | ORAL_TABLET | ORAL | Status: DC | PRN

## 2023-09-24 MED ORDER — DIPHENHYDRAMINE HCL 50 MG/ML IJ SOLN: 12.5000 mg | INTRAMUSCULAR | Status: DC | PRN

## 2023-09-24 MED ORDER — ACETAMINOPHEN 325 MG PO TABS: 650.0000 mg | ORAL_TABLET | ORAL | Status: DC | PRN

## 2023-09-24 MED ORDER — PHENYLEPHRINE 80 MCG/ML (10ML) SYRINGE FOR IV PUSH (FOR BLOOD PRESSURE SUPPORT)
80.0000 ug | PREFILLED_SYRINGE | INTRAVENOUS | Status: DC | PRN
Start: 2023-09-24 — End: 2023-09-25

## 2023-09-24 MED ORDER — LIDOCAINE HCL (PF) 1 % IJ SOLN: 30.0000 mL | INTRAMUSCULAR | Status: DC | PRN

## 2023-09-24 MED ORDER — LIDOCAINE HCL (PF) 1 % IJ SOLN
INTRAMUSCULAR | Status: DC | PRN
  Administered 2023-09-24 (×2): 4 mL via EPIDURAL

## 2023-09-24 MED ORDER — LACTATED RINGERS IV SOLN: 500.0000 mL | Freq: Once | INTRAVENOUS | Status: DC

## 2023-09-24 MED ORDER — SOD CITRATE-CITRIC ACID 500-334 MG/5ML PO SOLN: 30.0000 mL | ORAL | Status: DC | PRN

## 2023-09-24 MED ORDER — OXYTOCIN BOLUS FROM INFUSION
333.0000 mL | Freq: Once | INTRAVENOUS | Status: AC
  Administered 2023-09-24: 333 mL via INTRAVENOUS

## 2023-09-24 MED ORDER — OXYCODONE-ACETAMINOPHEN 5-325 MG PO TABS: 2.0000 | ORAL_TABLET | ORAL | Status: DC | PRN

## 2023-09-24 NOTE — Anesthesia Procedure Notes (Signed)
Epidural Patient location during procedure: OB Start time: 09/24/2023 5:24 PM End time: 09/24/2023 5:27 PM  Staffing Anesthesiologist: Kaylyn Layer, MD Performed: anesthesiologist   Preanesthetic Checklist Completed: patient identified, IV checked, risks and benefits discussed, monitors and equipment checked, pre-op evaluation and timeout performed  Epidural Patient position: sitting Prep: DuraPrep and site prepped and draped Patient monitoring: continuous pulse ox, blood pressure and heart rate Approach: midline Location: L3-L4 Injection technique: LOR air  Needle:  Needle type: Tuohy  Needle gauge: 17 G Needle length: 9 cm Needle insertion depth: 5 cm Catheter type: closed end flexible Catheter size: 19 Gauge Catheter at skin depth: 10 cm Test dose: negative and Other (1% lidocaine)  Assessment Events: blood not aspirated, no cerebrospinal fluid, injection not painful, no injection resistance, no paresthesia and negative IV test  Additional Notes Patient identified. Risks, benefits, and alternatives discussed with patient including but not limited to bleeding, infection, nerve damage, paralysis, failed block, incomplete pain control, headache, blood pressure changes, nausea, vomiting, reactions to medication, itching, and postpartum back pain. Confirmed with bedside nurse the patient's most recent platelet count. Confirmed with patient that they are not currently taking any anticoagulation, have any bleeding history, or any family history of bleeding disorders. Patient expressed understanding and wished to proceed. All questions were answered. Sterile technique was used throughout the entire procedure. Please see nursing notes for vital signs.   Crisp LOR on first pass. Test dose was given through epidural catheter and negative prior to continuing to dose epidural or start infusion. Warning signs of high block given to the patient including shortness of breath,  tingling/numbness in hands, complete motor block, or any concerning symptoms with instructions to call for help. Patient was given instructions on fall risk and not to get out of bed. All questions and concerns addressed with instructions to call with any issues or inadequate analgesia.  Reason for block:procedure for pain

## 2023-09-24 NOTE — Anesthesia Preprocedure Evaluation (Signed)
Anesthesia Evaluation  Patient identified by MRN, date of birth, ID band Patient awake    Reviewed: Allergy & Precautions, Patient's Chart, lab work & pertinent test results  History of Anesthesia Complications Negative for: history of anesthetic complications  Airway Mallampati: II  TM Distance: >3 FB Neck ROM: Full    Dental no notable dental hx.    Pulmonary neg pulmonary ROS   Pulmonary exam normal        Cardiovascular negative cardio ROS Normal cardiovascular exam     Neuro/Psych negative neurological ROS     GI/Hepatic negative GI ROS, Neg liver ROS,,,  Endo/Other  negative endocrine ROS    Renal/GU negative Renal ROS     Musculoskeletal negative musculoskeletal ROS (+)    Abdominal   Peds  Hematology negative hematology ROS (+)   Anesthesia Other Findings Day of surgery medications reviewed with patient.  Reproductive/Obstetrics (+) Pregnancy                             Anesthesia Physical Anesthesia Plan  ASA: 2  Anesthesia Plan: Epidural   Post-op Pain Management:    Induction:   PONV Risk Score and Plan: Treatment may vary due to age or medical condition  Airway Management Planned: Natural Airway  Additional Equipment: Fetal Monitoring  Intra-op Plan:   Post-operative Plan:   Informed Consent: I have reviewed the patients History and Physical, chart, labs and discussed the procedure including the risks, benefits and alternatives for the proposed anesthesia with the patient or authorized representative who has indicated his/her understanding and acceptance.       Plan Discussed with:   Anesthesia Plan Comments:        Anesthesia Quick Evaluation

## 2023-09-24 NOTE — H&P (Signed)
Nicole Tate is a 32 y.o. G2P0010 female at [redacted]w[redacted]d by LMP c/w 9w [place] presenting for non-reactive office NST and decreased fetal movement. She had a membrane sweep at the office and is feeling a little crampy. She has had some mild spotting. She still feels that baby is moving less than usual. She denies LOF or any other symptoms Her pregnancy is otherwise complicated by: -resolved choroid plexus cyst -anxiety: no meds, in therapy   Past Medical History:  Diagnosis Date   Family history of colon cancer    Family history of melanoma    Family history of pancreatic cancer    Family history of prostate cancer    Raynaud's disease    Past Surgical History:  Procedure Laterality Date   CHOLECYSTECTOMY     TUMOR REMOVAL     Family History: family history includes Colon cancer (age of onset: 26) in her maternal grandmother; Depression in her mother; Melanoma (age of onset: 95) in her mother; Pancreatic cancer (age of onset: 37) in her mother; Pancreatic cancer (age of onset: 20) in an other family member; Parkinson's disease in her paternal grandfather; Prostate cancer (age of onset: 80) in her maternal grandfather; Prostate cancer (age of onset: 98) in an other family member. Social History:  reports that she has never smoked. She has never used smokeless tobacco. She reports current alcohol use. She reports that she does not currently use drugs.     Maternal Diabetes: No Genetic Screening: Declined Maternal Ultrasounds/Referrals: Isolated choroid plexus cyst (resolved at 23w) Fetal Ultrasounds or other Referrals:  None Maternal Substance Abuse:  No Significant Maternal Medications:  Meds include: Other: fioricet, wellbutrin Significant Maternal Lab Results:  Group B Strep negative (09/11/23) Number of Prenatal Visits:greater than 3 verified prenatal visits Maternal Vaccinations:TDap and Flu Other Comments:  None  Review of Systems  All other systems reviewed and are  negative.  Maternal Medical History:  Contractions: Perceived severity is mild.   Fetal activity: Perceived fetal activity is decreased.   Last perceived fetal movement was within the past hour.   Prenatal complications: No bleeding, cholelithiasis, HIV, PIH, infection, IUGR, nephrolithiasis, oligohydramnios, placental abnormality, polyhydramnios, pre-eclampsia, preterm labor, substance abuse, thrombocytopenia or thrombophilia.   Prenatal Complications - Diabetes: none.   Dilation: 3 Effacement (%): 80 Station: -3 Exam by:: Dr. Clint Lipps Blood pressure 131/83, pulse 73, temperature 97.7 F (36.5 C), temperature source Oral, resp. rate 18, height 5\' 7"  (1.702 m), weight 92.9 kg, last menstrual period 12/25/2022. Maternal Exam:  Abdomen: Patient reports no abdominal tenderness. Fetal presentation: vertex Introitus: Vulva is negative for condylomata, edema, lesion, piercings and varicosities.  Pelvis: adequate for delivery.   Cervix: Cervix evaluated by digital exam.     Fetal Exam Fetal Monitor Review: Mode: ultrasound.   Baseline rate: 140.  Variability: moderate (6-25 bpm).   Pattern: accelerations present and no decelerations.   Fetal State Assessment: Category I - tracings are normal.   Physical Exam Constitutional:      Appearance: Normal appearance. She is normal weight.  Eyes:     Extraocular Movements: Extraocular movements intact.  Cardiovascular:     Rate and Rhythm: Normal rate and regular rhythm.     Heart sounds: No murmur heard.    No friction rub. No gallop.  Pulmonary:     Effort: Pulmonary effort is normal. No respiratory distress.     Breath sounds: Normal breath sounds. No stridor. No wheezing, rhonchi or rales.  Abdominal:     Tenderness: There  is no abdominal tenderness. There is no guarding.     Comments: gravid  Genitourinary: Vulva is no lesion.  Musculoskeletal:        General: Normal range of motion.     Cervical back: Normal range of motion.   Skin:    General: Skin is warm and dry.  Neurological:     General: No focal deficit present.     Mental Status: She is alert and oriented to person, place, and time.  Psychiatric:        Mood and Affect: Mood normal.        Behavior: Behavior normal.        Thought Content: Thought content normal.        Judgment: Judgment normal.     Prenatal labs: ABO, Rh: A pos Antibody: negative Rubella: immune RPR: NR HBsAg: negative HIV: negative  GBS: negative (09/11/23)  Assessment/Plan: Farida Pentz is a 32 y.o. G2P0010 female at [redacted]w[redacted]d by LMP c/w 9w [place] presenting for non-reactive office NST and decreased fetal movement. -IOL: plan pitocin followed by AROM -Fetal status: reassuring  Dispo: anticipate vaginal delivery   Anella Nakata A Lylliana Kitamura 09/24/2023, 1:44 PM

## 2023-09-24 NOTE — Progress Notes (Signed)
Nicole Tate is a 32 y.o. G2P0010 at [redacted]w[redacted]d by LMP c/w 9w [place] admitted for induction of labor due to Non-reactive NST and decreased fetal movement.  Subjective: "Nicole Tate" is feeling contractions more intensely. She was ok with AROM prior to epidural. She had an approximately 5 min vasovagal episode after AROM, but was monitored throughout and was feeling better thereafter.  Objective:    09/24/2023    5:13 PM 09/24/2023    5:11 PM 09/24/2023    3:47 PM  Vitals with BMI  Systolic 81 73 123  Diastolic 55 37 77  Pulse  59 84     FHT:  FHR: 145 bpm, variability: moderate,  accelerations:  Present,  decelerations:  Absent UC:   regular, every 3-5 minutes SVE:   Dilation: 3.5 Effacement (%): 90 Station: -2 Exam by:: Dr. Clint Lipps  Labs: Lab Results  Component Value Date   WBC 7.8 09/24/2023   HGB 14.2 09/24/2023   HCT 40.8 09/24/2023   MCV 88.5 09/24/2023   PLT 154 09/24/2023    Assessment / Plan: Induction of labor due to non-reassuring fetal testing and decreased fetal movement,  progressing well on pitocin  Labor: Progressing normally, s/p AROM at 17:00 Fetal Wellbeing:  Category I Pain Control:  Epidural Anticipated MOD:  NSVD  Willa Frater, MD 09/24/2023, 5:35 PM

## 2023-09-25 ENCOUNTER — Encounter (HOSPITAL_COMMUNITY): Payer: Self-pay | Admitting: Obstetrics and Gynecology

## 2023-09-25 LAB — RPR: RPR Ser Ql: NONREACTIVE

## 2023-09-25 LAB — CBC
HCT: 35.9 % — ABNORMAL LOW (ref 36.0–46.0)
Hemoglobin: 12.6 g/dL (ref 12.0–15.0)
MCH: 31.4 pg (ref 26.0–34.0)
MCHC: 35.1 g/dL (ref 30.0–36.0)
MCV: 89.5 fL (ref 80.0–100.0)
Platelets: 153 10*3/uL (ref 150–400)
RBC: 4.01 MIL/uL (ref 3.87–5.11)
RDW: 12.4 % (ref 11.5–15.5)
WBC: 13.9 10*3/uL — ABNORMAL HIGH (ref 4.0–10.5)
nRBC: 0 % (ref 0.0–0.2)

## 2023-09-25 MED ORDER — DIBUCAINE (PERIANAL) 1 % EX OINT
1.0000 | TOPICAL_OINTMENT | CUTANEOUS | Status: DC | PRN
Start: 2023-09-25 — End: 2023-09-26

## 2023-09-25 MED ORDER — SIMETHICONE 80 MG PO CHEW: 80.0000 mg | CHEWABLE_TABLET | ORAL | Status: DC | PRN

## 2023-09-25 MED ORDER — WITCH HAZEL-GLYCERIN EX PADS: 1.0000 | MEDICATED_PAD | CUTANEOUS | Status: DC | PRN

## 2023-09-25 MED ORDER — AMMONIA AROMATIC IN INHA
RESPIRATORY_TRACT | Status: AC
  Filled 2023-09-25: qty 10

## 2023-09-25 MED ORDER — SENNOSIDES-DOCUSATE SODIUM 8.6-50 MG PO TABS
2.0000 | ORAL_TABLET | Freq: Every day | ORAL | Status: DC
  Administered 2023-09-26: 2 via ORAL
  Filled 2023-09-25 (×2): qty 2

## 2023-09-25 MED ORDER — IBUPROFEN 600 MG PO TABS
600.0000 mg | ORAL_TABLET | Freq: Four times a day (QID) | ORAL | Status: DC
  Administered 2023-09-25 – 2023-09-26 (×6): 600 mg via ORAL
  Filled 2023-09-25 (×6): qty 1

## 2023-09-25 MED ORDER — BENZOCAINE-MENTHOL 20-0.5 % EX AERO
1.0000 | INHALATION_SPRAY | CUTANEOUS | Status: DC | PRN
  Administered 2023-09-25: 1 via TOPICAL
  Filled 2023-09-25: qty 56

## 2023-09-25 MED ORDER — ONDANSETRON HCL 4 MG/2ML IJ SOLN: 4.0000 mg | INTRAMUSCULAR | Status: DC | PRN

## 2023-09-25 MED ORDER — TETANUS-DIPHTH-ACELL PERTUSSIS 5-2.5-18.5 LF-MCG/0.5 IM SUSY: 0.5000 mL | PREFILLED_SYRINGE | Freq: Once | INTRAMUSCULAR | Status: DC

## 2023-09-25 MED ORDER — ONDANSETRON HCL 4 MG PO TABS: 4.0000 mg | ORAL_TABLET | ORAL | Status: DC | PRN

## 2023-09-25 MED ORDER — PRENATAL MULTIVITAMIN CH
1.0000 | ORAL_TABLET | Freq: Every day | ORAL | Status: DC
  Administered 2023-09-25 – 2023-09-26 (×2): 1 via ORAL
  Filled 2023-09-25 (×2): qty 1

## 2023-09-25 MED ORDER — COCONUT OIL OIL
1.0000 | TOPICAL_OIL | Status: DC | PRN
  Administered 2023-09-25: 1 via TOPICAL

## 2023-09-25 MED ORDER — DIPHENHYDRAMINE HCL 25 MG PO CAPS
25.0000 mg | ORAL_CAPSULE | Freq: Four times a day (QID) | ORAL | Status: DC | PRN
Start: 2023-09-25 — End: 2023-09-26

## 2023-09-25 MED ORDER — MAGNESIUM OXIDE -MG SUPPLEMENT 400 (240 MG) MG PO TABS
400.0000 mg | ORAL_TABLET | Freq: Every day | ORAL | Status: DC
  Administered 2023-09-25 – 2023-09-26 (×2): 400 mg via ORAL
  Filled 2023-09-25 (×2): qty 1

## 2023-09-25 MED ORDER — ACETAMINOPHEN 325 MG PO TABS
650.0000 mg | ORAL_TABLET | ORAL | Status: DC | PRN
  Administered 2023-09-25 (×2): 650 mg via ORAL
  Filled 2023-09-25 (×2): qty 2

## 2023-09-25 NOTE — Lactation Note (Signed)
This note was copied from a baby's chart. Lactation Consultation Note  Patient Name: Nicole Tate RUEAV'W Date: 09/25/2023 Age:31 hours Reason for consult: Follow-up assessment;Mother's request;Primapara;1st time breastfeeding;Term;Nipple pain/trauma;Breastfeeding assistance;RN request  P1- RN requested LC's assistance because MOB was crying due to being overwhelmed with breastfeeding. MOB reports that infant has been nursing well, but only if someone is helping her. MOB states that she has not been able to successfully latch infant herself. FOB also reported that infant screams every time they place infant in the bassinet, so they have to hold her and MOB has not gotten any sleep.   LC offered to verbally talk through latching infant, so MOB could gain confidence in latching infant herself. MOB thanked [facility] and agreed to plan. MOB placed infant on the right breast in the football hold. Infant was belly to belly, but not nose to nipple. LC talked MOB through on how to bring her at nipple level. When MOB attempted to latch her, Infant would go onto the breast, but not suck. Infant became very gaggy on the breast and needed to be bulb suctioned. This happened about three attempts before LC recommended hand expressing instead. MOB agreed.  LC talked through how to hand express, but MOB stated that it hurt when she did it and asked for [facility] to do it. LC was able to collect 0.5 mL which was spoon fed to infant. Infant tolerated EBM well. LC then swaddled infant tightly and placed her in the bassinet. Infant fell asleep instantly. LC encouraged MOB to get some rest and call for further assistance as needed.  Maternal Data Has patient been taught Hand Expression?: Yes Does the patient have breastfeeding experience prior to this delivery?: No  Feeding Mother's Current Feeding Choice: Breast Milk  LATCH Score Latch: Too sleepy or reluctant, no latch achieved, no sucking elicited.  Audible  Swallowing: None  Type of Nipple: Everted at rest and after stimulation  Comfort (Breast/Nipple): Soft / non-tender  Hold (Positioning): Assistance needed to correctly position infant at breast and maintain latch.  LATCH Score: 5   Lactation Tools Discussed/Used Pump Education: Milk Storage  Interventions Interventions: Breast feeding basics reviewed;Assisted with latch;Hand express;Breast compression;Adjust position;Support pillows;Position options;Expressed milk;Education;LC Services brochure  Discharge Discharge Education: Warning signs for feeding baby  Consult Status Consult Status: Follow-up Date: 09/26/23 Follow-up type: In-patient    Dema Severin BS, IBCLC 09/25/2023, 5:14 PM

## 2023-09-25 NOTE — Progress Notes (Signed)
Patient is doing well.  She is ambulating, voiding, tolerating PO.  Pain control is good.  Lochia is appropriate  Vitals:   09/25/23 0113 09/25/23 0116 09/25/23 0229 09/25/23 0328  BP: 116/69 117/67 119/72 123/72  Pulse: 76 78 76 87  Resp:   18 16  Temp:   97.7 F (36.5 C) 98.2 F (36.8 C)  TempSrc:   Oral Oral  SpO2:   97%   Weight:      Height:        NAD Fundus firm Ext: no edema  Lab Results  Component Value Date   WBC 13.9 (H) 09/25/2023   HGB 12.6 09/25/2023   HCT 35.9 (L) 09/25/2023   MCV 89.5 09/25/2023   PLT 153 09/25/2023    --/--/A POS (11/27 1319)  A/P 32 y.o. G1P1001 PPD#1 s/p TSVD. Routine care.   Expect d/c tomorrow.    [facility] GEFFEL [employer]

## 2023-09-25 NOTE — Lactation Note (Signed)
This note was copied from a baby's chart. Lactation Consultation Note  Patient Name: Nicole Tate [id number] Date: 09/25/2023 Age:31 hours Reason for consult: Initial assessment;Primapara;Term;1st time breastfeeding  P1, 39 wks, @ 9 hrs of life. Discussed expectations of first day of life- sleepy, reminding baby to eat every 3 hours. Advancing to cluster feeding around second night of life. Highlighted size of stomach and feeding/diapering expectations in first days. LC O/P services after DC and milk storage of EBM highlighted. Mom attentive and receptive. Discussed watching for feeding cues over the next hour, if not seen -awakening baby to ensure feeding offered at 3 hours. Encouraged mom to call lactation as much as desired.   Maternal Data Does the patient have breastfeeding experience prior to this delivery?: No  Feeding Mother's Current Feeding Choice: Breast Milk  Interventions Interventions: Breast feeding basics reviewed;Education;LC Services brochure (Milk Storage Guidelines)  Discharge Pump: DEBP;Personal (Spectra @ home)  Consult Status Consult Status: Follow-up Date: 09/26/23    Idamae Lusher 09/25/2023, 8:52 AM

## 2023-09-25 NOTE — Anesthesia Postprocedure Evaluation (Signed)
Anesthesia Post Note  Patient: Nicole Tate  Procedure(s) Performed: AN AD HOC LABOR EPIDURAL     Patient location during evaluation: Mother Baby Anesthesia Type: Epidural Level of consciousness: awake Pain management: satisfactory to patient Vital Signs Assessment: post-procedure vital signs reviewed and stable Respiratory status: spontaneous breathing Cardiovascular status: stable Anesthetic complications: no  No notable events documented.  Last Vitals:  Vitals:   09/25/23 0328 09/25/23 0726  BP: 123/72 112/83  Pulse: 87 66  Resp: 16 20  Temp: 36.8 C 36.7 C  SpO2:      Last Pain:  Vitals:   09/25/23 0726  TempSrc: Oral  PainSc: 0-No pain   Pain Goal:                   [employer]

## 2023-09-25 NOTE — Lactation Note (Signed)
This note was copied from a baby's chart. Lactation Consultation Note  Patient Name: Nicole Tate EXBMW'U Date: 09/25/2023 Age:31 hours Reason for consult: Follow-up assessment;1st time breastfeeding;Primapara;Mother's request;Term  P1, 39 wks, 14 hrs of life. Infant in football position ready to latch with LC arrival. Demonstrated hand expression, easily visualized colostrum. Demonstrated holds/positioning, discussed/demonstrated steps of latching. Infant responds well, latches and works at breast with little encouragements. Mom questions if initial pain of latching normal, encouraged a few seconds- but not lasting. Mom agrees discomfort goes away- Infant works well on right breast for 15 minutes. Nipple inspection and EBM to nipple post-feed for breast health encouraged. Discussed good latch components to watch for, contentment post feed to watch for, and how to break latch and re-latch if needed.  Maternal Data Has patient been taught Hand Expression?: Yes Does the patient have breastfeeding experience prior to this delivery?: No  Feeding Mother's Current Feeding Choice: Breast Milk  LATCH Score Latch: Grasps breast easily, tongue down, lips flanged, rhythmical sucking.  Audible Swallowing: Spontaneous and intermittent  Type of Nipple: Everted at rest and after stimulation  Comfort (Breast/Nipple): Soft / non-tender  Hold (Positioning): Assistance needed to correctly position infant at breast and maintain latch.  LATCH Score: 9   Lactation Tools Discussed/Used    Interventions Interventions: Breast feeding basics reviewed;Assisted with latch;Skin to skin;Breast compression;Hand express;Position options;Education  Discharge    Consult Status Consult Status: Follow-up Date: 09/26/23 Follow-up type: In-patient    [facility] 09/25/2023, 1:25 PM

## 2023-09-25 NOTE — Progress Notes (Signed)
MOB was referred for history of depression/anxiety. * Referral screened out by Clinical Social Worker because none of the following criteria appear to apply: ~ History of anxiety/depression during this pregnancy, or of post-partum depression. ~ Diagnosis of anxiety and/or depression within last 3 years OR * MOB's symptoms currently being treated with medication and/or therapy. Per OB notes, MOB has a therapist.  Inocente Salles Score is 5.  Please contact the Clinical Social Worker if needs arise, or if MOB requests.   Blaine Hamper, MSW, LCSW Clinical Social Work [phone]

## 2023-09-26 NOTE — Progress Notes (Signed)
Patient is doing well.  She is ambulating, voiding, tolerating PO.  Pain control is good.  Lochia is appropriate  Vitals:   09/25/23 0726 09/25/23 1244 09/25/23 1719 09/26/23 0500  BP: 112/83 107/60 122/82 119/73  Pulse: 66 75 72 89  Resp: 20 20 18 16   Temp: 98.1 F (36.7 C) 98.3 F (36.8 C) 98.4 F (36.9 C) 97.7 F (36.5 C)  TempSrc: Oral Oral Oral Oral  SpO2:      Weight:      Height:        NAD Fundus firm Ext: no edema  Lab Results  Component Value Date   WBC 13.9 (H) 09/25/2023   HGB 12.6 09/25/2023   HCT 35.9 (L) 09/25/2023   MCV 89.5 09/25/2023   PLT 153 09/25/2023    --/--/A POS (11/27 1319)  A/P 32 y.o. G1P1001 PPD#2. Routine care.   Expect d/c today.    [facility] GEFFEL [employer]

## 2023-09-26 NOTE — Lactation Note (Signed)
This note was copied from a baby's chart. Lactation Consultation Note  Patient Name: Nicole Tate BJYNW'G Date: 09/26/2023 Age:31 hours Reason for consult: Follow-up assessment;Term;Primapara;1st time breastfeeding;Breastfeeding assistance;Mother's request;Maternal discharge  P1, 39 wks, @ 33 hrs of life. Discharge anticipated today. Infant feeding well with LC arrival- football position right breast. Highlighted aspects of good latch for mom. Discussed baby more awake today, following feeding cues from baby instead of watching the clock. Discussed expectations over the next two nights of cluster feeding- brings in milk supply. After feeding well on right, demonstrated cross cradle on the left breast to help mom experience varied positioning options. Encouraged mom to do what feels right for her/baby. Infant feeds for several minutes. Highlighted milk coming in expectations, encouraged mom in the movement of her milk/breast stimulation key to milk production. Discussed prevention and treatment of engorgement, as well as recognition of plugged ducts, and mastitis.   Maternal Data Has patient been taught Hand Expression?: Yes Does the patient have breastfeeding experience prior to this delivery?: No  Feeding Mother's Current Feeding Choice: Breast Milk  LATCH Score Latch: Grasps breast easily, tongue down, lips flanged, rhythmical sucking.  Audible Swallowing: Spontaneous and intermittent  Type of Nipple: Everted at rest and after stimulation  Comfort (Breast/Nipple): Soft / non-tender  Hold (Positioning): Assistance needed to correctly position infant at breast and maintain latch.  LATCH Score: 9   Lactation Tools Discussed/Used    Interventions Interventions: Breast feeding basics reviewed;Assisted with latch;Skin to skin;Hand express;Breast compression;Adjust position;Education  Discharge    Consult Status Consult Status: Complete Date: 09/26/23    Idamae Lusher 09/26/2023, 8:50 AM

## 2023-09-26 NOTE — Discharge Summary (Signed)
Postpartum Discharge Summary  Date of Service updated 09/26/23     Patient Name: Nicole Tate DOB: [birth year] MRN: [id number]  Date of admission: 09/24/2023 Delivery date:09/24/2023 Delivering provider: Ambrose Mantle A Date of discharge: 09/26/2023  Admitting diagnosis: Pregnant and not yet delivered in third trimester [Z34.93] Intrauterine pregnancy: [redacted]w[redacted]d     Secondary diagnosis:  Principal Problem:   Pregnant and not yet delivered in third trimester  Additional problems: Non-reactive NST and decreased fetal movement    Discharge diagnosis: Term Pregnancy Delivered                                              Post partum procedures: n/a Augmentation: AROM and Pitocin Complications: None  Hospital course: Induction of Labor With Vaginal Delivery   32 y.o. yo G1P1001 at [redacted]w[redacted]d was admitted to the hospital 09/24/2023 for induction of labor.  Indication for induction:  NR NST and decreased fetal movement .  Patient had an labor course complicated by n/a Membrane Rupture Time/Date: 5:05 PM,09/24/2023  Delivery Method:Vaginal, Spontaneous Operative Delivery:N/A Episiotomy: None Lacerations:  2nd degree Details of delivery can be found in separate delivery note.  Patient had a postpartum course complicated by n/a. Patient is discharged home 09/26/23.  Newborn Data: Birth date:09/24/2023 Birth time:10:52 PM Gender:Female Living status:Living Apgars:8 ,9  Weight:3827 g   T-DaP:Given prenatally Flu: Yes RSV Vaccine received: No  Immunizations administered: There is no immunization history for the selected administration types on file for this patient.  Physical exam  Vitals:   09/25/23 0726 09/25/23 1244 09/25/23 1719 09/26/23 0500  BP: 112/83 107/60 122/82 119/73  Pulse: 66 75 72 89  Resp: 20 20 18 16   Temp: 98.1 F (36.7 C) 98.3 F (36.8 C) 98.4 F (36.9 C) 97.7 F (36.5 C)  TempSrc: Oral Oral Oral Oral  SpO2:      Weight:      Height:        General: alert, cooperative, and no distress Lochia: appropriate Uterine Fundus: firm Incision: N/A DVT Evaluation: No evidence of DVT seen on physical exam. Labs: Lab Results  Component Value Date   WBC 13.9 (H) 09/25/2023   HGB 12.6 09/25/2023   HCT 35.9 (L) 09/25/2023   MCV 89.5 09/25/2023   PLT 153 09/25/2023      Latest Ref Rng & Units 11/04/2021   12:18 PM  CMP  Glucose 70 - 99 mg/dL 829   BUN 6 - 20 mg/dL 11   Creatinine 5.62 - 1.00 mg/dL 1.30   Sodium 865 - 784 mmol/L 138   Potassium 3.5 - 5.1 mmol/L 4.0   Chloride 98 - 111 mmol/L 105   CO2 22 - 32 mmol/L 26   Calcium 8.9 - 10.3 mg/dL 8.9   Total Protein 6.5 - 8.1 g/dL 6.8   Total Bilirubin 0.3 - 1.2 mg/dL 0.6   Alkaline Phos 38 - 126 U/L 38   AST 15 - 41 U/L 20   ALT 0 - 44 U/L 28    Edinburgh Score:    09/25/2023    2:30 AM  Edinburgh Postnatal Depression Scale Screening Tool  I have been able to laugh and see the funny side of things. 0  I have looked forward with enjoyment to things. 0  I have blamed myself unnecessarily when things went wrong. 0  I have been anxious or  worried for no good reason. 2  I have felt scared or panicky for no good reason. 2  Things have been getting on top of me. 1  I have been so unhappy that I have had difficulty sleeping. 0  I have felt sad or miserable. 0  I have been so unhappy that I have been crying. 0  The thought of harming myself has occurred to me. 0  Edinburgh Postnatal Depression Scale Total 5      After visit meds:  Allergies as of 09/26/2023   No Known Allergies      Medication List     STOP taking these medications    levonorgestrel-ethinyl estradiol 0.15-0.03 MG tablet Commonly known as: SEASONALE   sertraline 50 MG tablet Commonly known as: ZOLOFT         Discharge home in stable condition Infant Feeding: Breast Infant Disposition:home with mother Discharge instruction: per After Visit Summary and Postpartum booklet. Activity:  Advance as tolerated. Pelvic rest for 6 weeks.  Diet: routine diet Anticipated Birth Control: Unsure Postpartum Appointment:6 weeks Additional Postpartum F/U:  n/a Future Appointments:No future appointments. Follow up Visit:  Follow-up Information     Shivaji, Valerie Roys, MD Follow up in 6 week(s).   Specialty: Obstetrics and Gynecology Contact information: [address] [place] [address] [phone]                     09/26/2023 [facility] Lizabeth Leyden, MD

## 2023-09-28 ENCOUNTER — Encounter (HOSPITAL_COMMUNITY): Payer: Self-pay | Admitting: Obstetrics and Gynecology

## 2023-09-28 ENCOUNTER — Other Ambulatory Visit: Payer: Self-pay

## 2023-09-28 ENCOUNTER — Inpatient Hospital Stay (HOSPITAL_COMMUNITY)
Admission: AD | Admit: 2023-09-28 | Discharge: 2023-09-28 | Disposition: A | Discharge status: Home / Self Care | Payer: Commercial Managed Care - PPO | Attending: Obstetrics and Gynecology | Admitting: Obstetrics and Gynecology

## 2023-09-28 DIAGNOSIS — O9089 Other complications of the puerperium, not elsewhere classified: Secondary | ICD-10-CM | POA: Diagnosis present

## 2023-09-28 DIAGNOSIS — R030 Elevated blood-pressure reading, without diagnosis of hypertension: Secondary | ICD-10-CM | POA: Insufficient documentation

## 2023-09-28 DIAGNOSIS — R519 Headache, unspecified: Secondary | ICD-10-CM | POA: Diagnosis not present

## 2023-09-28 LAB — COMPREHENSIVE METABOLIC PANEL
ALT: 64 U/L — ABNORMAL HIGH (ref 0–44)
AST: 61 U/L — ABNORMAL HIGH (ref 15–41)
Albumin: 3.1 g/dL — ABNORMAL LOW (ref 3.5–5.0)
Alkaline Phosphatase: 95 U/L (ref 38–126)
Anion gap: 6 (ref 5–15)
BUN: 13 mg/dL (ref 6–20)
CO2: 24 mmol/L (ref 22–32)
Calcium: 8.9 mg/dL (ref 8.9–10.3)
Chloride: 109 mmol/L (ref 98–111)
Creatinine, Ser: 0.75 mg/dL (ref 0.44–1.00)
GFR, Estimated: >60 mL/min (ref >60)
Glucose, Bld: 88 mg/dL (ref 70–99)
Potassium: 4.1 mmol/L (ref 3.5–5.1)
Sodium: 139 mmol/L (ref 135–145)
Total Bilirubin: 0.5 mg/dL (ref <1.2)
Total Protein: 6.3 g/dL — ABNORMAL LOW (ref 6.5–8.1)

## 2023-09-28 LAB — CBC
HCT: 38.3 % (ref 36.0–46.0)
Hemoglobin: 13 g/dL (ref 12.0–15.0)
MCH: 30.8 pg (ref 26.0–34.0)
MCHC: 33.9 g/dL (ref 30.0–36.0)
MCV: 90.8 fL (ref 80.0–100.0)
Platelets: 207 10*3/uL (ref 150–400)
RBC: 4.22 MIL/uL (ref 3.87–5.11)
RDW: 12.6 % (ref 11.5–15.5)
WBC: 8.6 10*3/uL (ref 4.0–10.5)
nRBC: 0 % (ref 0.0–0.2)

## 2023-09-28 NOTE — MAU Provider Note (Signed)
History     CSN: [id number]  Arrival date and time: 09/28/23 4098   Event Date/Time   First Provider Initiated Contact with Patient 09/28/23 1747      Chief Complaint  Patient presents with   Hypertension   HPI Ms. Nicole Tate is a 32 y.o. year old G40P1001 female at 4 days PP from SVD who presents to MAU reporting not feeling well and getting a BP reading of 148/101 after checking it at [facility] kiosk. She reports a dull H/A (rated 1/10) and seeing spots. Although seeing spots is not a new problem, she "sees spots all the time.". She denies epigastric pain. She receives [facility] with [facility].  OB History     Gravida  1   Para  1   Term  1   Preterm      AB      Living  1      SAB      IAB      Ectopic      Multiple  0   Live Births  1           Past Medical History:  Diagnosis Date   Family history of colon cancer    Family history of melanoma    Family history of pancreatic cancer    Family history of prostate cancer    Raynaud's disease     Past Surgical History:  Procedure Laterality Date   CHOLECYSTECTOMY     TUMOR REMOVAL      Family History  Problem Relation Age of Onset   Depression Mother    Pancreatic cancer Mother 78   Melanoma Mother 2   Colon cancer Maternal Grandmother 43   Prostate cancer Maternal Grandfather 12   Parkinson's disease Paternal Grandfather    Pancreatic cancer Other 46       MGMs father   Prostate cancer Other 28       MGMs father    Social History   Tobacco Use   Smoking status: Never   Smokeless tobacco: Never  Vaping Use   Vaping status: Never Used  Substance Use Topics   Alcohol use: Yes    Comment: Occasionally drinks once every 2 months   Drug use: Not Currently    Allergies: No Known Allergies  Medications Prior to Admission  Medication Sig Dispense Refill Last Dose   ibuprofen (ADVIL) 600 MG tablet Take 600 mg by mouth every 6 (six) hours as needed for mild pain (pain score  1-3).   09/28/2023   Prenatal Vit-Fe Fumarate-FA (PRENATAL MULTIVITAMIN) TABS tablet Take 1 tablet by mouth daily at 12 noon.   09/28/2023    Review of Systems  Constitutional: Negative.   HENT: Negative.    Eyes:  Positive for visual disturbance ("seeing spots"; not a new a problem, "been seeing spots").  Respiratory: Negative.    Cardiovascular: Negative.   Gastrointestinal: Negative.   Endocrine: Negative.   Genitourinary: Negative.   Musculoskeletal: Negative.   Skin: Negative.   Allergic/Immunologic: Negative.   Neurological:  Positive for headaches ("dull"; 1/10).  Hematological: Negative.   Psychiatric/Behavioral:  The patient is nervous/anxious (tearful).    Physical Exam   Patient Vitals for the past 24 hrs:  BP Temp Temp src Pulse Resp SpO2 Height Weight  09/28/23 1815 128/79 -- -- 83 -- 99 % -- --  09/28/23 1800 120/78 -- -- 87 -- 98 % -- --  09/28/23 1746 119/70 -- -- 79 -- -- -- --  09/28/23 1731 127/75 -- -- 88 -- -- -- --  09/28/23 1716 128/76 -- -- 86 -- -- -- --  09/28/23 1659 138/82 -- -- 82 -- -- -- --  09/28/23 1645 137/87 97.9 F (36.6 C) Oral 93 19 99 % -- --  09/28/23 1639 -- -- -- -- -- -- 5\' 7"  (1.702 m) 84.4 kg     Physical Exam Vitals and nursing note reviewed.  Constitutional:      Appearance: Normal appearance. She is normal weight.  Cardiovascular:     Rate and Rhythm: Normal rate.  Pulmonary:     Effort: Pulmonary effort is normal.  Abdominal:     Palpations: Abdomen is soft.  Genitourinary:    Comments: deferred Musculoskeletal:        General: Normal range of motion.  Neurological:     Mental Status: She is alert and oriented to person, place, and time.  Psychiatric:        Attention and Perception: Attention and perception normal.        Mood and Affect: Mood is anxious. Affect is tearful.        Behavior: Behavior normal. Behavior is cooperative.        Thought Content: Thought content normal.        Cognition and Memory:  Cognition and memory normal.        Judgment: Judgment normal.    MAU Course  Procedures  MDM CCUA CBC CMP Serial BP's   Results for orders placed or performed during the hospital encounter of 09/28/23 (from the past 24 hour(s))  CBC     Status: None   Collection Time: 09/28/23  4:54 PM  Result Value Ref Range   WBC 8.6 4.0 - 10.5 K/uL   RBC 4.22 3.87 - 5.11 MIL/uL   Hemoglobin 13.0 12.0 - 15.0 g/dL   HCT 42.5 95.6 - 38.7 %   MCV 90.8 80.0 - 100.0 fL   MCH 30.8 26.0 - 34.0 pg   MCHC 33.9 30.0 - 36.0 g/dL   RDW 56.4 33.2 - 95.1 %   Platelets 207 150 - 400 K/uL   nRBC 0.0 0.0 - 0.2 %  Comprehensive metabolic panel     Status: Abnormal   Collection Time: 09/28/23  4:54 PM  Result Value Ref Range   Sodium 139 135 - 145 mmol/L   Potassium 4.1 3.5 - 5.1 mmol/L   Chloride 109 98 - 111 mmol/L   CO2 24 22 - 32 mmol/L   Glucose, Bld 88 70 - 99 mg/dL   BUN 13 6 - 20 mg/dL   Creatinine, Ser 8.84 0.44 - 1.00 mg/dL   Calcium 8.9 8.9 - 16.6 mg/dL   Total Protein 6.3 (L) 6.5 - 8.1 g/dL   Albumin 3.1 (L) 3.5 - 5.0 g/dL   AST 61 (H) 15 - 41 U/L   ALT 64 (H) 0 - 44 U/L   Alkaline Phosphatase 95 38 - 126 U/L   Total Bilirubin 0.5 <1.2 mg/dL   GFR, Estimated >06 >30 mL/min   Anion gap 6 5 - 15     *Consult with Dr. Reina Fuse @ [phone] - notified of patient's complaints, assessments, lab results, recommended tx plan d/c home with f/u appt in office and repeat PEC labs tomorrow - ok to d/c home, agrees with plan  Assessment and Plan  1. Elevated blood pressure reading without diagnosis of hypertension - Advised to stop taking BP at [employer] or [employer] - Only worry about  taking her BP, if her H/A is so severe that Tylenol or Ibuprofen do not relieve it, she has sudden onset of RUQ and/or epigastric pain, change in spots before her eyes  2. Postpartum headache - Can take Tylenol or Ibuprofen for H/A  - Discharge patient - Dr. Reina Fuse 's RN is to call patient tomorrow for time to  come to office with time to go for repeat BP check and labs - Patient verbalized an understanding of the plan of care and agrees.   Raelyn Mora, CNM 09/28/2023, 5:47 PM

## 2023-09-28 NOTE — Discharge Instructions (Signed)
Dr. Reina Fuse will have the office nurse call you with a date and time that  you need to go to the office tomorrow to go to the office. She wants you to have repeat blood work and blood pressure tomorrow.

## 2023-09-28 NOTE — MAU Note (Signed)
Nicole Tate is a 32 y.o. at Unknown here in MAU reporting: she hasn't been feeling well today, checked BP and BP 148/101.  Endorses dull HA and seeing spots, reports always sees spots, nothing new. Denies epigastric pain. S/P NVD 09/24/2023 LMP: NA Onset of complaint: today Pain score: 1 Vitals:   09/28/23 1645  BP: 137/87  Pulse: 93  Resp: 19  Temp: 97.9 F (36.6 C)  SpO2: 99%     FHT:NA Lab orders placed from triage:   None

## 2023-10-04 ENCOUNTER — Telehealth (HOSPITAL_COMMUNITY): Payer: Self-pay | Admitting: *Deleted

## 2023-10-04 NOTE — Telephone Encounter (Signed)
10/04/2023  Name: Nicole Tate MRN: [id number] DOB: Mar 12, 1991  Reason for Call:  Transition of Care Hospital Discharge Call  Contact Status: Patient Contact Status: Complete  Language assistant needed:          Follow-Up Questions: Do You Have Any Concerns About Your Health As You Heal From Delivery?: No Do You Have Any Concerns About Your Infants Health?: No  Edinburgh Postnatal Depression Scale:  In the Past 7 Days: I have been able to laugh and see the funny side of things.: As much as I always could I have looked forward with enjoyment to things.: As much as I ever did I have blamed myself unnecessarily when things went wrong.: No, never I have been anxious or worried for no good reason.: Yes, very often I have felt scared or panicky for no good reason.: No, not much Things have been getting on top of me.: No, I have been coping as well as ever I have been so unhappy that I have had difficulty sleeping.: Not at all I have felt sad or miserable.: Not very often I have been so unhappy that I have been crying.: No, never The thought of harming myself has occurred to me.: Never Edinburgh Postnatal Depression Scale Total: 5  PHQ2-9 Depression Scale:     Discharge Follow-up: Edinburgh score requires follow up?: No Patient was advised of the following resources:: Breastfeeding Support Group, Support Group  Post-discharge interventions: Reviewed Newborn Safe Sleep Practices  Signature Deforest Hoyles, RN, 10/04/23, [phone]
